# Patient Record
Sex: Female | Born: 1980 | Hispanic: Refuse to answer | Marital: Single | Smoking: Never smoker
Health system: Southern US, Community
[De-identification: ages and names within clinical notes are randomized; demographics above are authoritative.]

## PROBLEM LIST (undated history)

## (undated) DIAGNOSIS — O24419 Gestational diabetes mellitus in pregnancy, unspecified control: Secondary | ICD-10-CM

## (undated) DIAGNOSIS — N979 Female infertility, unspecified: Secondary | ICD-10-CM

## (undated) HISTORY — PX: NO PAST SURGERIES: SHX2092

## (undated) HISTORY — DX: Gestational diabetes mellitus in pregnancy, unspecified control: O24.419

## (undated) HISTORY — DX: Female infertility, unspecified: N97.9

---

## 2019-03-24 LAB — OB RESULTS CONSOLE GC/CHLAMYDIA
Chlamydia: NEGATIVE
Gonorrhea: NEGATIVE

## 2019-03-24 LAB — OB RESULTS CONSOLE PLATELET COUNT: Platelets: 363

## 2019-03-24 LAB — OB RESULTS CONSOLE HIV ANTIBODY (ROUTINE TESTING): HIV: NONREACTIVE

## 2019-03-24 LAB — OB RESULTS CONSOLE ANTIBODY SCREEN: Antibody Screen: NEGATIVE

## 2019-03-24 LAB — OB RESULTS CONSOLE HEPATITIS B SURFACE ANTIGEN: Hepatitis B Surface Ag: NEGATIVE

## 2019-03-24 LAB — OB RESULTS CONSOLE HGB/HCT, BLOOD
HCT: 33 (ref 29–41)
Hemoglobin: 10.8

## 2019-03-24 LAB — GLUCOSE, 1 HOUR
Drug Screen, Urine: NEGATIVE
Glucose 1 Hour: 157
Pap: NEGATIVE
Urine Culture, OB: NEGATIVE

## 2019-03-24 LAB — OB RESULTS CONSOLE RPR: RPR: NONREACTIVE

## 2019-03-24 LAB — OB RESULTS CONSOLE VARICELLA ZOSTER ANTIBODY, IGG: Varicella: IMMUNE

## 2019-03-24 LAB — OB RESULTS CONSOLE ABO/RH: RH Type: POSITIVE

## 2019-03-24 LAB — OB RESULTS CONSOLE RUBELLA ANTIBODY, IGM: Rubella: IMMUNE

## 2019-03-31 LAB — GLUCOSE, 3 HOUR GESTATIONAL
Glucose 1 Hour: 217
Glucose 2 Hour: 152
Glucose 3 Hour: 137
Glucose Tolerance, Fasting: 99 (ref 70–99)

## 2019-04-11 ENCOUNTER — Encounter: Payer: Self-pay | Admitting: *Deleted

## 2019-04-11 DIAGNOSIS — O0990 Supervision of high risk pregnancy, unspecified, unspecified trimester: Secondary | ICD-10-CM

## 2019-04-11 DIAGNOSIS — O24419 Gestational diabetes mellitus in pregnancy, unspecified control: Secondary | ICD-10-CM

## 2019-04-19 ENCOUNTER — Ambulatory Visit (INDEPENDENT_AMBULATORY_CARE_PROVIDER_SITE_OTHER): Payer: Medicaid Other | Admitting: Family Medicine

## 2019-04-19 ENCOUNTER — Ambulatory Visit: Payer: Medicaid Other | Admitting: *Deleted

## 2019-04-19 ENCOUNTER — Encounter: Payer: Self-pay | Admitting: Family Medicine

## 2019-04-19 ENCOUNTER — Encounter: Payer: Medicaid Other | Attending: Obstetrics & Gynecology | Admitting: *Deleted

## 2019-04-19 ENCOUNTER — Other Ambulatory Visit: Payer: Self-pay

## 2019-04-19 ENCOUNTER — Encounter: Payer: Self-pay | Admitting: General Practice

## 2019-04-19 VITALS — BP 118/78 | HR 86 | Wt 150.0 lb

## 2019-04-19 DIAGNOSIS — O26842 Uterine size-date discrepancy, second trimester: Secondary | ICD-10-CM

## 2019-04-19 DIAGNOSIS — Z713 Dietary counseling and surveillance: Secondary | ICD-10-CM | POA: Diagnosis present

## 2019-04-19 DIAGNOSIS — O24419 Gestational diabetes mellitus in pregnancy, unspecified control: Secondary | ICD-10-CM

## 2019-04-19 DIAGNOSIS — O0992 Supervision of high risk pregnancy, unspecified, second trimester: Secondary | ICD-10-CM

## 2019-04-19 DIAGNOSIS — O09522 Supervision of elderly multigravida, second trimester: Secondary | ICD-10-CM

## 2019-04-19 DIAGNOSIS — Z3A24 24 weeks gestation of pregnancy: Secondary | ICD-10-CM

## 2019-04-19 DIAGNOSIS — O0990 Supervision of high risk pregnancy, unspecified, unspecified trimester: Secondary | ICD-10-CM

## 2019-04-19 MED ORDER — ACCU-CHEK FASTCLIX LANCETS MISC: 1.0000 | Freq: Four times a day (QID) | 12 refills | Status: DC

## 2019-04-19 MED ORDER — BLOOD PRESSURE KIT DEVI: 1.0000 | Freq: Once | 0 refills | Status: AC

## 2019-04-19 MED ORDER — ACCU-CHEK SOFTCLIX LANCETS MISC: 12 refills | Status: DC

## 2019-04-19 MED ORDER — GLUCOSE BLOOD VI STRP: ORAL_STRIP | 12 refills | Status: DC

## 2019-04-19 MED ORDER — ACCU-CHEK NANO SMARTVIEW W/DEVICE KIT: 1.0000 | PACK | 0 refills | Status: DC

## 2019-04-19 NOTE — Progress Notes (Signed)
  Subjective:  Melissa Solomon is a G1P0 3w5dbeing seen today for her first obstetrical visit. [facility].  Her obstetrical history is significant for first pregnancy and advanced maternal age. Patient does intend to breast feed. Pregnancy history fully reviewed.  Patient reports no complaints.  BP 118/78   Pulse 86   Wt 150 lb (68 kg)   LMP 10/28/2018 (Exact Date)   BMI 31.35 kg/m   HISTORY: OB History  Gravida Para Term Preterm AB Living  1            SAB TAB Ectopic Multiple Live Births               # Outcome Date GA Lbr Len/2nd Weight Sex Delivery Anes PTL Lv  1 Current             Past Medical History:  Diagnosis Date  . 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 No pertinent surgical history.  Family History  Problem Relation Age of Onset  . Hypertension Mother      Exam  BP 118/78   Pulse 86   Wt 150 lb (68 kg)   LMP 10/28/2018 (Exact Date)   BMI 31.35 kg/m   CONSTITUTIONAL: Well-developed, well-nourished female in no acute distress.  HENT:  Normocephalic, atraumatic, External right and left ear normal. Oropharynx is clear and moist EYES: Conjunctivae and EOM are normal. Pupils are equal, round, and reactive to light. No scleral icterus.  NECK: Normal range of motion, supple, no masses.  Normal thyroid.  CARDIOVASCULAR: Normal heart rate noted, regular rhythm RESPIRATORY: Clear to auscultation bilaterally. Effort and breath sounds normal, no problems with respiration noted. ABDOMEN: Soft, normal bowel sounds, no distention noted.  No tenderness, rebound or guarding.  MUSCULOSKELETAL: Normal range of motion. No tenderness.  No cyanosis, clubbing, or edema.  2+ distal pulses. SKIN: Skin is warm and dry. No rash noted. Not diaphoretic. No erythema. No pallor. NEUROLOGIC: Alert and oriented to person, place, and time. Normal reflexes, muscle tone  coordination. No cranial nerve deficit noted. PSYCHIATRIC: Normal mood and affect. Normal behavior. Normal judgment and thought content.    Assessment:    Pregnancy: G1P0 Patient Active Problem List   Diagnosis Date Noted  . Gestational diabetes mellitus (GDM) 04/11/2019  . Supervision of high risk pregnancy, antepartum 04/11/2019      Plan:   1. Supervision of high risk pregnancy, antepartum FHT normal. Size/date discrepancy - Blood Pressure Monitoring (BLOOD PRESSURE KIT) DEVI; 1 Device by Does not apply route once for 1 dose. High risk pregnancy o09.90; speaks spanish  Dispense: 1 each; Refill: 0  2. Gestational diabetes mellitus (GDM), antepartum, gestational diabetes method of control unspecified Discussed importnce of glycemic control.  Diabetes education today Supplies sent to pharmacy Delivery between 39-40weeks - [place] OB DET[place] +14 WK; Future  3. Advanced maternal age in multigravida, second trimester     Problem list reviewed and updated. 75% of 30 min visit spent on counseling and coordination of care.     JTruett Mainland12/29/2020

## 2019-04-19 NOTE — Progress Notes (Signed)
Patient was seen on 04/19/2019 for Gestational Diabetes self-management. EDD 08/04/2019. Patient states no history of GDM. Diet history obtained. Patient has limited starch intake, eats a good variety of all protein and non-starchy vegetables.  Beverages include water, and very little juice.  The following learning objectives were met by the patient :   States why dietary management is important in controlling blood glucose  Describes the effects of carbohydrates on blood glucose levels  Demonstrates ability to create a balanced meal plan  Demonstrates carbohydrate counting   States when to check blood glucose levels  Demonstrates proper blood glucose monitoring techniques  States the effect of stress and exercise on blood glucose levels  Plan:  Aim for 3 Carb Choices per meal (45 grams) +/- 1 either way  Aim for 1-2 Carbs per snack Begin reading food labels for Total Carbohydrate of foods If OK with your MD, consider  increasing your activity level by walking, Arm Chair Exercises or other activity daily as tolerated Begin checking BG before breakfast and 2 hours after first bite of breakfast, lunch and dinner as directed by MD  Bring Log Book/Sheet and meter to every medical appointment  Patient not appropriate for Baby Scripts due to language barrier and does feel comfortable using smart phone for data entry Take medication if directed by MD  Blood glucose monitor given: none - Demonstrated Accu-chek Guide me  Blood glucose monitor Rx called into pharmacy: Accu Check Guide with [employer]   Patient instructed to test pre breakfast and 2 hours each meal as directed by MD  Patient instructed to monitor glucose levels: FBS: 60 - 95 mg/dl 2 hour: <120 mg/dl  Patient received the following handouts:  Nutrition Diabetes and Pregnancy  Carbohydrate Counting List  BG Log Sheet  Patient will be seen for follow-up as needed.  

## 2019-04-21 ENCOUNTER — Other Ambulatory Visit: Payer: Self-pay

## 2019-04-21 ENCOUNTER — Other Ambulatory Visit: Payer: Self-pay | Admitting: *Deleted

## 2019-04-21 DIAGNOSIS — O24419 Gestational diabetes mellitus in pregnancy, unspecified control: Secondary | ICD-10-CM

## 2019-05-05 ENCOUNTER — Encounter: Payer: Self-pay | Admitting: Obstetrics and Gynecology

## 2019-05-05 ENCOUNTER — Other Ambulatory Visit: Payer: Self-pay

## 2019-05-05 ENCOUNTER — Ambulatory Visit (INDEPENDENT_AMBULATORY_CARE_PROVIDER_SITE_OTHER): Payer: Medicaid Other | Admitting: Obstetrics and Gynecology

## 2019-05-05 VITALS — BP 109/71 | HR 76 | Wt 148.0 lb

## 2019-05-05 DIAGNOSIS — Z3A27 27 weeks gestation of pregnancy: Secondary | ICD-10-CM

## 2019-05-05 DIAGNOSIS — O09522 Supervision of elderly multigravida, second trimester: Secondary | ICD-10-CM

## 2019-05-05 DIAGNOSIS — O09529 Supervision of elderly multigravida, unspecified trimester: Secondary | ICD-10-CM | POA: Insufficient documentation

## 2019-05-05 DIAGNOSIS — O09523 Supervision of elderly multigravida, third trimester: Secondary | ICD-10-CM

## 2019-05-05 DIAGNOSIS — Z23 Encounter for immunization: Secondary | ICD-10-CM

## 2019-05-05 DIAGNOSIS — O24410 Gestational diabetes mellitus in pregnancy, diet controlled: Secondary | ICD-10-CM

## 2019-05-05 DIAGNOSIS — O0990 Supervision of high risk pregnancy, unspecified, unspecified trimester: Secondary | ICD-10-CM

## 2019-05-05 DIAGNOSIS — O0992 Supervision of high risk pregnancy, unspecified, second trimester: Secondary | ICD-10-CM

## 2019-05-05 MED ORDER — METFORMIN HCL 500 MG PO TABS: 1000.0000 mg | ORAL_TABLET | Freq: Every day | ORAL | 3 refills | Status: DC

## 2019-05-05 MED ORDER — VITAFOL ULTRA 29-0.6-0.4-200 MG PO CAPS: 1.0000 | ORAL_CAPSULE | Freq: Every day | ORAL | 12 refills | Status: DC

## 2019-05-05 NOTE — Patient Instructions (Signed)
https://www.cdc.gov/vaccines/hcp/vis/vis-statements/tdap.pdf">  °Vacuna Tdap (tétanos, difteria y tos ferina): lo que debe saber °Tdap (Tetanus, Diphtheria, Pertussis) Vaccine: What You Need to Know °1. ¿Por qué vacunarse? °La vacuna Tdap puede prevenir el tétanos, la difteria y la tos ferina. °La difteria y la tos ferina se contagian de persona a persona. El tétanos ingresa al organismo a través de cortes o heridas. °· El TÉTANOS (T) provoca rigidez dolorosa en los músculos. El tétanos puede causar graves problemas de salud, como no poder abrir la boca, tener dificultad para tragar y respirar, o la muerte. °· La DIFTERIA (D) puede causar dificultad para respirar, insuficiencia cardíaca, parálisis o muerte. °· La TOS FERINA (aP) también conocida como “tos convulsa” puede causar tos violenta e incontrolable lo que hace difícil respirar, comer o beber. La tos ferina puede ser muy grave en los bebés y en los niños pequeños, y causar neumonía, convulsiones, daño cerebral o la muerte. En adolescentes y adultos, puede causar pérdida de peso, pérdida del control de la vejiga, desmayos y fracturas de costillas al toser de manera intensa. °2. Vacuna Tdap °La vacuna Tdap es solo para niños de 7 años en adelante, adolescentes y adultos.  °Los adolescentes debe recibir una dosis única de la vacuna Tdap, preferentemente a los 11 o 12 años. °Las mujeres embarazadas deben recibir una dosis de la vacuna Tdap en cada embarazo para proteger al recién nacido de la tos ferina. Los bebés tienen mayor riesgo de sufrir complicaciones graves y potencialmente mortales debido a la tos ferina. °Los adultos que nunca recibieron la vacuna Tdap deben recibir una dosis. °Además, los adultos deben recibir una dosis de refuerzo cada 10 años, o antes si la persona sufre una quemadura o una herida grave y sucia. Las dosis de refuerzo pueden ser de la vacuna Tdap o la Td (una vacuna diferente que protege contra el tétanos y la difteria, pero no contra  la tos ferina). °La vacuna Tdap puede ser administrada al mismo tiempo que otras vacunas. °3. Hable con el médico °Comuníquese con la persona que le coloca las vacunas si la persona que la recibe: °· Ha tenido una reacción alérgica después de una dosis anterior de cualquier vacuna contra el tétanos, la difteria o la tos ferina, o cualquier alergia grave, potencialmente mortal. °· Ha tenido un coma, disminución del nivel de la conciencia o convulsiones prolongadas dentro de los 7 días posteriores a una dosis anterior de cualquier vacuna contra la tos ferina (DTP, DTaP o Tdap). °· Tiene convulsiones u otro problema del sistema nervioso. °· Alguna vez tuvo síndrome de Guillain-Barré (también llamado SGB). °· Ha tenido dolor intenso o hinchazón después de una dosis anterior de cualquier vacuna contra el tétanos o la difteria. °En algunos casos, es posible que el médico decida posponer la aplicación de la vacuna Tdap para una visita en el futuro.  °Las personas que sufren trastornos menores, como un resfrío, pueden vacunarse. Las personas que tienen enfermedades moderadas o graves generalmente deben esperar hasta recuperarse para poder recibir la vacuna Tdap.  °Su médico puede darle más información. °4. Riesgos de una reacción a la vacuna °· Después de recibir la vacuna Tdap a veces se puede tener dolor, enrojecimiento o hinchazón en el lugar donde se aplicó la inyección, fiebre leve, dolor de cabeza, sensación de cansancio y náuseas, vómitos, diarrea o dolor de estómago. °Las personas a veces se desmayan después de procedimientos médicos, incluida la vacunación. Informe al médico si se siente mareado, tiene cambios en la visión o zumbidos   en los oídos.  °Al igual que con cualquier medicamento, existe una probabilidad muy remota de que una vacuna cause una reacción alérgica grave, otra lesión grave o la muerte. °5. ¿Qué pasa si se presenta un problema grave? °Podría producirse una reacción alérgica después de que la  persona vacunada abandone la clínica. Si observa signos de una reacción alérgica grave (ronchas, hinchazón de la cara y la garganta, dificultad para respirar, latidos cardíacos acelerados, mareos o debilidad), llame al 9-1-1 y lleve a la persona al hospital más cercano. °Si se presentan otros signos que le preocupan, comuníquese con su médico.  °Las reacciones adversas deben informarse al Sistema de Informe de Eventos Adversos de Vacunas (Vaccine Adverse Event Reporting System, VAERS). Por lo general, el médico presenta este informe o puede hacerlo usted mismo. Visite el sitio web del VAERS en www.vaers.hhs.gov o llame al 1-800-822-7967. El VAERS es solo para informar reacciones; su personal no proporciona asesoramiento médico. °6. Programa Nacional de Compensación de Daños por Vacunas °El Programa Nacional de Compensación de Daños por Vacunas (National Vaccine Injury Compensation Program, VICP) es un programa federal que fue creado para compensar a las personas que puedan haber sufrido daños al recibir ciertas vacunas. Visite el sitio web del VICP en www.hrsa.gov/vaccinecompensation o llame al 1-800-338-2382 para obtener más información acerca del programa y de cómo presentar un reclamo. Hay un límite de tiempo para presentar un reclamo de compensación. °7. ¿Cómo puedo obtener más información? °· Pregúntele a su médico. °· Comuníquese con el servicio de salud de su localidad o su estado. °· Comuníquese con los Centers for Disease Control and Prevention, CDC (Centros para el Control y la Prevención de Enfermedades): °? Llame al 1-800-232-4636 (1-800-CDC-INFO) o °? Visite el sitio web de los CDC en www.cdc.gov/vaccines °Declaración de información de la vacuna Tdap (tétanos, difteria y tos ferina) (04/24/2018) °Esta información no tiene como fin reemplazar el consejo del médico. Asegúrese de hacerle al médico cualquier pregunta que tenga. °Document Revised: 08/17/2018 Document Reviewed: 08/17/2018 °Elsevier Patient  Education © 2020 Elsevier Inc. ° ° °

## 2019-05-05 NOTE — Progress Notes (Signed)
   PRENATAL VISIT NOTE  Subjective:  Melissa Solomon is a 39 y.o. G1P0 at [redacted]w[redacted]d being seen today for ongoing prenatal care.  She is currently monitored for the following issues for this high-risk pregnancy and has Gestational diabetes mellitus (GDM); Supervision of high risk pregnancy, antepartum; and AMA (advanced maternal age) multigravida 35+ on their problem list.  Patient reports no complaints.  Contractions: Not present. Vag. Bleeding: None.  Movement: Present. Denies leaking of fluid.   The following portions of the patient's history were reviewed and updated as appropriate: allergies, current medications, past family history, past medical history, past social history, past surgical history and problem list.   Objective:   Vitals:   05/05/19 1045  BP: 109/71  Pulse: 76  Weight: 148 lb (67.1 kg)    Fetal Status: Fetal Heart Rate (bpm): 140 Fundal Height: 27 cm Movement: Present     General:  Alert, oriented and cooperative. Patient is in no acute distress.  Skin: Skin is warm and dry. No rash noted.   Cardiovascular: Normal heart rate noted  Respiratory: Normal respiratory effort, no problems with respiration noted  Abdomen: Soft, gravid, appropriate for gestational age.  Pain/Pressure: Absent     Pelvic: Cervical exam deferred        Extremities: Normal range of motion.  Edema: None  Mental Status: Normal mood and affect. Normal behavior. Normal judgment and thought content.   Assessment and Plan:  Pregnancy: G1P0 at [redacted]w[redacted]d 1. Supervision of high risk pregnancy, antepartum Patient is doing well without complaints Third trimester labs today - Tdap vaccine greater than or equal to 7yo IM  2. Diet controlled gestational diabetes mellitus (GDM) in third trimester CBGs reviewed and all fasting elevated. Will start metformin 1000 mg in evening RTC in 2 weeks to review CBGs  3. Multigravida of advanced maternal age in third trimester   Preterm labor symptoms and  general obstetric precautions including but not limited to vaginal bleeding, contractions, leaking of fluid and fetal movement were reviewed in detail with the patient. Please refer to After Visit Summary for other counseling recommendations.   No follow-ups on file.  No future appointments.  Catalina Antigua, MD

## 2019-05-06 LAB — CBC
Hematocrit: 32.5 % — ABNORMAL LOW (ref 34.0–46.6)
Hemoglobin: 11.1 g/dL (ref 11.1–15.9)
MCH: 30.6 pg (ref 26.6–33.0)
MCHC: 34.2 g/dL (ref 31.5–35.7)
MCV: 90 fL (ref 79–97)
Platelets: 327 10*3/uL (ref 150–450)
RBC: 3.63 x10E6/uL — ABNORMAL LOW (ref 3.77–5.28)
RDW: 12.4 % (ref 11.7–15.4)
WBC: 11.3 10*3/uL — ABNORMAL HIGH (ref 3.4–10.8)

## 2019-05-06 LAB — RPR: RPR Ser Ql: NONREACTIVE

## 2019-05-06 LAB — HIV ANTIBODY (ROUTINE TESTING W REFLEX): HIV Screen 4th Generation wRfx: NONREACTIVE

## 2019-05-19 ENCOUNTER — Encounter: Payer: Self-pay | Admitting: Obstetrics and Gynecology

## 2019-05-19 ENCOUNTER — Other Ambulatory Visit: Payer: Self-pay

## 2019-05-19 ENCOUNTER — Ambulatory Visit (INDEPENDENT_AMBULATORY_CARE_PROVIDER_SITE_OTHER): Payer: Medicaid Other | Admitting: Obstetrics and Gynecology

## 2019-05-19 VITALS — BP 104/68 | HR 81 | Wt 152.8 lb

## 2019-05-19 DIAGNOSIS — O0993 Supervision of high risk pregnancy, unspecified, third trimester: Secondary | ICD-10-CM

## 2019-05-19 DIAGNOSIS — Z3A29 29 weeks gestation of pregnancy: Secondary | ICD-10-CM

## 2019-05-19 DIAGNOSIS — O09523 Supervision of elderly multigravida, third trimester: Secondary | ICD-10-CM

## 2019-05-19 DIAGNOSIS — O24410 Gestational diabetes mellitus in pregnancy, diet controlled: Secondary | ICD-10-CM

## 2019-05-19 DIAGNOSIS — O0990 Supervision of high risk pregnancy, unspecified, unspecified trimester: Secondary | ICD-10-CM

## 2019-05-19 MED ORDER — INSULIN NPH (HUMAN) (ISOPHANE) 100 UNIT/ML ~~LOC~~ SUSP: 12.0000 [IU] | Freq: Every day | SUBCUTANEOUS | 3 refills | Status: DC

## 2019-05-19 NOTE — Progress Notes (Signed)
   PRENATAL VISIT NOTE  Subjective:  Melissa Solomon is a 39 y.o. G1P0 at [redacted]w[redacted]d being seen today for ongoing prenatal care.  She is currently monitored for the following issues for this high-risk pregnancy and has Gestational diabetes mellitus (GDM); Supervision of high risk pregnancy, antepartum; and AMA (advanced maternal age) multigravida 35+ on their problem list.  Patient reports no complaints.  Contractions: Irritability. Vag. Bleeding: None.  Movement: Present. Denies leaking of fluid.   The following portions of the patient's history were reviewed and updated as appropriate: allergies, current medications, past family history, past medical history, past social history, past surgical history and problem list.   Objective:   Vitals:   05/19/19 1435  BP: 104/68  Pulse: 81  Weight: 152 lb 12.8 oz (69.3 kg)    Fetal Status:     Movement: Present     General:  Alert, oriented and cooperative. Patient is in no acute distress.  Skin: Skin is warm and dry. No rash noted.   Cardiovascular: Normal heart rate noted  Respiratory: Normal respiratory effort, no problems with respiration noted  Abdomen: Soft, gravid, appropriate for gestational age.  Pain/Pressure: Absent     Pelvic: Cervical exam deferred        Extremities: Normal range of motion.  Edema: Trace  Mental Status: Normal mood and affect. Normal behavior. Normal judgment and thought content.   Assessment and Plan:  Pregnancy: G1P0 at [redacted]w[redacted]d 1. Supervision of high risk pregnancy, antepartum Patient is doing well without complaints  2. Gestational diabetes mellitus (GDM) in third trimester Patient started on metformin last visit CBGs reviewed and fasting persistently elevated as high as 115. Majority of pp within range Will start NPH 12 units at bedtime  Growth ultrasound ordered  3. Multigravida of advanced maternal age in third trimester   Preterm labor symptoms and general obstetric precautions including but  not limited to vaginal bleeding, contractions, leaking of fluid and fetal movement were reviewed in detail with the patient. Please refer to After Visit Summary for other counseling recommendations.   Return in about 2 weeks (around 06/02/2019) for Virtual, ROB, High risk.  No future appointments.  Catalina Antigua, MD

## 2019-05-27 ENCOUNTER — Ambulatory Visit (HOSPITAL_COMMUNITY): Payer: Medicaid Other

## 2019-06-02 ENCOUNTER — Encounter (HOSPITAL_COMMUNITY): Payer: Self-pay

## 2019-06-02 ENCOUNTER — Ambulatory Visit (HOSPITAL_COMMUNITY): Payer: Medicaid Other | Admitting: *Deleted

## 2019-06-02 ENCOUNTER — Ambulatory Visit (HOSPITAL_COMMUNITY)
Admission: RE | Admit: 2019-06-02 | Discharge: 2019-06-02 | Disposition: A | Discharge status: Home / Self Care | Payer: Medicaid Other | Source: Ambulatory Visit | Attending: Obstetrics and Gynecology | Admitting: Obstetrics and Gynecology

## 2019-06-02 ENCOUNTER — Encounter: Payer: Medicaid Other | Admitting: Obstetrics & Gynecology

## 2019-06-02 ENCOUNTER — Other Ambulatory Visit: Payer: Self-pay

## 2019-06-02 ENCOUNTER — Other Ambulatory Visit (HOSPITAL_COMMUNITY): Payer: Self-pay | Admitting: *Deleted

## 2019-06-02 DIAGNOSIS — O24414 Gestational diabetes mellitus in pregnancy, insulin controlled: Secondary | ICD-10-CM

## 2019-06-02 DIAGNOSIS — O0990 Supervision of high risk pregnancy, unspecified, unspecified trimester: Secondary | ICD-10-CM | POA: Diagnosis present

## 2019-06-02 DIAGNOSIS — Z3A31 31 weeks gestation of pregnancy: Secondary | ICD-10-CM

## 2019-06-02 DIAGNOSIS — O24419 Gestational diabetes mellitus in pregnancy, unspecified control: Secondary | ICD-10-CM | POA: Diagnosis present

## 2019-06-02 DIAGNOSIS — O09513 Supervision of elderly primigravida, third trimester: Secondary | ICD-10-CM

## 2019-06-02 NOTE — Progress Notes (Deleted)
   Patient did not show up today for her scheduled appointment.   Brannen Koppen, MD, FACOG Obstetrician & Gynecologist, [facility], [facility]  

## 2019-06-09 ENCOUNTER — Encounter: Payer: Medicaid Other | Admitting: Obstetrics & Gynecology

## 2019-06-09 ENCOUNTER — Ambulatory Visit (HOSPITAL_COMMUNITY): Admission: RE | Admit: 2019-06-09 | Payer: Medicaid Other | Source: Ambulatory Visit

## 2019-06-09 ENCOUNTER — Ambulatory Visit (HOSPITAL_COMMUNITY): Payer: Medicaid Other

## 2019-06-10 ENCOUNTER — Ambulatory Visit (HOSPITAL_COMMUNITY): Payer: Medicaid Other | Attending: Obstetrics and Gynecology

## 2019-06-10 ENCOUNTER — Encounter (HOSPITAL_COMMUNITY): Payer: Self-pay

## 2019-06-10 ENCOUNTER — Inpatient Hospital Stay (HOSPITAL_COMMUNITY): Admission: RE | Admit: 2019-06-10 | Payer: Medicaid Other | Source: Ambulatory Visit

## 2019-06-13 ENCOUNTER — Ambulatory Visit (INDEPENDENT_AMBULATORY_CARE_PROVIDER_SITE_OTHER): Payer: Medicaid Other | Admitting: Family Medicine

## 2019-06-13 ENCOUNTER — Other Ambulatory Visit: Payer: Self-pay

## 2019-06-13 ENCOUNTER — Encounter: Payer: Self-pay | Admitting: Family Medicine

## 2019-06-13 VITALS — BP 108/64 | HR 76 | Wt 151.0 lb

## 2019-06-13 DIAGNOSIS — Z3A32 32 weeks gestation of pregnancy: Secondary | ICD-10-CM

## 2019-06-13 DIAGNOSIS — O09523 Supervision of elderly multigravida, third trimester: Secondary | ICD-10-CM

## 2019-06-13 DIAGNOSIS — O0990 Supervision of high risk pregnancy, unspecified, unspecified trimester: Secondary | ICD-10-CM

## 2019-06-13 DIAGNOSIS — O24414 Gestational diabetes mellitus in pregnancy, insulin controlled: Secondary | ICD-10-CM

## 2019-06-13 MED ORDER — VITAFOL ULTRA 29-0.6-0.4-200 MG PO CAPS: 1.0000 | ORAL_CAPSULE | Freq: Every day | ORAL | 12 refills | Status: AC

## 2019-06-13 MED ORDER — INSULIN NPH (HUMAN) (ISOPHANE) 100 UNIT/ML ~~LOC~~ SUSP: 12.0000 [IU] | Freq: Every day | SUBCUTANEOUS | 3 refills | Status: DC

## 2019-06-13 MED ORDER — GLUCOSE BLOOD VI STRP: ORAL_STRIP | 12 refills | Status: DC

## 2019-06-13 NOTE — Progress Notes (Signed)
Spanish Interpreter Raquel M.  

## 2019-06-13 NOTE — Progress Notes (Signed)
Subjective:  Melissa Solomon is a 39 y.o. G1P0 at [redacted]w[redacted]d being seen today for ongoing prenatal care.  She is currently monitored for the following issues for this high-risk pregnancy and has Gestational diabetes mellitus (GDM); Supervision of high risk pregnancy, antepartum; and AMA (advanced maternal age) multigravida 35+ on their problem list.  GDM: Patient taking insulin NPH 12 units at night.  Reports a couple hypoglycemic episodes.  Tolerating medication well Fasting: controlled - 94-95 2hr PP: controlled  Patient reports no complaints.  Contractions: Irritability. Vag. Bleeding: None.  Movement: Present. Denies leaking of fluid.   The following portions of the patient's history were reviewed and updated as appropriate: allergies, current medications, past family history, past medical history, past social history, past surgical history and problem list. Problem list updated.  Objective:   Vitals:   06/13/19 0841  BP: 108/64  Pulse: 76  Weight: 151 lb (68.5 kg)    Fetal Status: Fetal Heart Rate (bpm): 138   Movement: Present     General:  Alert, oriented and cooperative. Patient is in no acute distress.  Skin: Skin is warm and dry. No rash noted.   Cardiovascular: Normal heart rate noted  Respiratory: Normal respiratory effort, no problems with respiration noted  Abdomen: Soft, gravid, appropriate for gestational age. Pain/Pressure: Absent     Pelvic: Vag. Bleeding: None     Cervical exam deferred        Extremities: Normal range of motion.  Edema: Trace  Mental Status: Normal mood and affect. Normal behavior. Normal judgment and thought content.   Urinalysis:      Assessment and Plan:  Pregnancy: G1P0 at [redacted]w[redacted]d  1. Supervision of high risk pregnancy, antepartum FHT and FH normal  2. Multigravida of advanced maternal age in third trimester  3. Insulin controlled gestational diabetes mellitus (GDM) in third trimester Continue NPH at night Continue to watch CBGs  closely BPP on Thursday Induction at 39 weeks  Preterm labor symptoms and general obstetric precautions including but not limited to vaginal bleeding, contractions, leaking of fluid and fetal movement were reviewed in detail with the patient. Please refer to After Visit Summary for other counseling recommendations.  Return in about 2 weeks (around 06/27/2019).   Levie Heritage, DO

## 2019-06-16 ENCOUNTER — Encounter (HOSPITAL_COMMUNITY): Payer: Self-pay

## 2019-06-16 ENCOUNTER — Ambulatory Visit (HOSPITAL_COMMUNITY): Payer: Medicaid Other | Admitting: *Deleted

## 2019-06-16 ENCOUNTER — Other Ambulatory Visit: Payer: Self-pay

## 2019-06-16 ENCOUNTER — Ambulatory Visit (HOSPITAL_COMMUNITY)
Admission: RE | Admit: 2019-06-16 | Discharge: 2019-06-16 | Disposition: A | Discharge status: Home / Self Care | Payer: Medicaid Other | Source: Ambulatory Visit | Attending: Obstetrics and Gynecology | Admitting: Obstetrics and Gynecology

## 2019-06-16 DIAGNOSIS — O09523 Supervision of elderly multigravida, third trimester: Secondary | ICD-10-CM | POA: Insufficient documentation

## 2019-06-16 DIAGNOSIS — O24414 Gestational diabetes mellitus in pregnancy, insulin controlled: Secondary | ICD-10-CM

## 2019-06-16 DIAGNOSIS — Z3A33 33 weeks gestation of pregnancy: Secondary | ICD-10-CM

## 2019-06-16 DIAGNOSIS — O0990 Supervision of high risk pregnancy, unspecified, unspecified trimester: Secondary | ICD-10-CM | POA: Diagnosis present

## 2019-06-16 DIAGNOSIS — O09513 Supervision of elderly primigravida, third trimester: Secondary | ICD-10-CM | POA: Diagnosis not present

## 2019-06-23 ENCOUNTER — Ambulatory Visit (HOSPITAL_COMMUNITY)
Admission: RE | Admit: 2019-06-23 | Discharge: 2019-06-23 | Disposition: A | Discharge status: Home / Self Care | Payer: Medicaid Other | Source: Ambulatory Visit | Attending: Obstetrics and Gynecology | Admitting: Obstetrics and Gynecology

## 2019-06-23 ENCOUNTER — Encounter (HOSPITAL_COMMUNITY): Payer: Self-pay

## 2019-06-23 ENCOUNTER — Ambulatory Visit (HOSPITAL_COMMUNITY): Payer: Medicaid Other | Admitting: *Deleted

## 2019-06-23 ENCOUNTER — Other Ambulatory Visit: Payer: Self-pay

## 2019-06-23 DIAGNOSIS — O0990 Supervision of high risk pregnancy, unspecified, unspecified trimester: Secondary | ICD-10-CM | POA: Insufficient documentation

## 2019-06-23 DIAGNOSIS — O24414 Gestational diabetes mellitus in pregnancy, insulin controlled: Secondary | ICD-10-CM | POA: Insufficient documentation

## 2019-06-23 DIAGNOSIS — O09513 Supervision of elderly primigravida, third trimester: Secondary | ICD-10-CM | POA: Diagnosis not present

## 2019-06-23 DIAGNOSIS — Z3A34 34 weeks gestation of pregnancy: Secondary | ICD-10-CM

## 2019-06-23 DIAGNOSIS — O09523 Supervision of elderly multigravida, third trimester: Secondary | ICD-10-CM | POA: Insufficient documentation

## 2019-06-24 ENCOUNTER — Other Ambulatory Visit (HOSPITAL_COMMUNITY): Payer: Self-pay | Admitting: *Deleted

## 2019-06-24 DIAGNOSIS — O24414 Gestational diabetes mellitus in pregnancy, insulin controlled: Secondary | ICD-10-CM

## 2019-06-30 ENCOUNTER — Ambulatory Visit (HOSPITAL_COMMUNITY)
Admission: RE | Admit: 2019-06-30 | Discharge: 2019-06-30 | Disposition: A | Discharge status: Home / Self Care | Payer: Medicaid Other | Source: Ambulatory Visit | Attending: Obstetrics and Gynecology | Admitting: Obstetrics and Gynecology

## 2019-06-30 ENCOUNTER — Encounter (HOSPITAL_COMMUNITY): Payer: Self-pay

## 2019-06-30 ENCOUNTER — Other Ambulatory Visit: Payer: Self-pay

## 2019-06-30 ENCOUNTER — Ambulatory Visit (HOSPITAL_COMMUNITY): Payer: Medicaid Other | Admitting: *Deleted

## 2019-06-30 ENCOUNTER — Ambulatory Visit (INDEPENDENT_AMBULATORY_CARE_PROVIDER_SITE_OTHER): Payer: Medicaid Other | Admitting: Obstetrics & Gynecology

## 2019-06-30 VITALS — BP 122/68 | HR 79 | Wt 153.7 lb

## 2019-06-30 DIAGNOSIS — O09513 Supervision of elderly primigravida, third trimester: Secondary | ICD-10-CM

## 2019-06-30 DIAGNOSIS — O0990 Supervision of high risk pregnancy, unspecified, unspecified trimester: Secondary | ICD-10-CM

## 2019-06-30 DIAGNOSIS — Z3A35 35 weeks gestation of pregnancy: Secondary | ICD-10-CM

## 2019-06-30 DIAGNOSIS — O09523 Supervision of elderly multigravida, third trimester: Secondary | ICD-10-CM

## 2019-06-30 DIAGNOSIS — O24414 Gestational diabetes mellitus in pregnancy, insulin controlled: Secondary | ICD-10-CM

## 2019-06-30 DIAGNOSIS — Z362 Encounter for other antenatal screening follow-up: Secondary | ICD-10-CM | POA: Diagnosis not present

## 2019-06-30 DIAGNOSIS — O24410 Gestational diabetes mellitus in pregnancy, diet controlled: Secondary | ICD-10-CM | POA: Insufficient documentation

## 2019-06-30 NOTE — Progress Notes (Addendum)
   PRENATAL VISIT NOTE  Subjective:  Melissa Solomon is a 39 y.o. G1P0 at [redacted]w[redacted]d being seen today for ongoing prenatal care.  She is currently monitored for the following issues for this high-risk pregnancy and has Gestational diabetes mellitus (GDM); Supervision of high risk pregnancy, antepartum; and AMA (advanced maternal age) multigravida 35+ on their problem list.  Patient reports no complaints.  Contractions: Not present. Vag. Bleeding: None.  Movement: Present. Denies leaking of fluid.   The following portions of the patient's history were reviewed and updated as appropriate: allergies, current medications, past family history, past medical history, past social history, past surgical history and problem list.   Objective:   Vitals:   06/30/19 1614  BP: 122/68  Pulse: 79  Weight: 153 lb 11.2 oz (69.7 kg)    Fetal Status: Fetal Heart Rate (bpm): 128 Fundal Height: 35 cm Movement: Present     General:  Alert, oriented and cooperative. Patient is in no acute distress.  Skin: Skin is warm and dry. No rash noted.   Cardiovascular: Normal heart rate noted  Respiratory: Normal respiratory effort, no problems with respiration noted  Abdomen: Soft, gravid, appropriate for gestational age.  Pain/Pressure: Present     Pelvic: Cervical exam deferred        Extremities: Normal range of motion.  Edema: Trace  Mental Status: Normal mood and affect. Normal behavior. Normal judgment and thought content.   Assessment and Plan:  Pregnancy: G1P0 at [redacted]w[redacted]d 1. Insulin controlled gestational diabetes mellitus (GDM) in third trimester Control is good, Babyscripts data seen  2. Supervision of high risk pregnancy, antepartum Good fetal growth  3. Multigravida of advanced maternal age in third trimester   Preterm labor symptoms and general obstetric precautions including but not limited to vaginal bleeding, contractions, leaking of fluid and fetal movement were reviewed in detail with the  patient. Please refer to After Visit Summary for other counseling recommendations.   Return in about 1 week (around 07/07/2019) for GBS.  Future Appointments  Date Time Provider Department Center  07/07/2019  1:45 PM [facility] NURSE [facility]  07/07/2019  1:45 PM [facility] [place] 5 [facility]  07/14/2019  1:45 PM [facility] NURSE [facility]  07/14/2019  1:45 PM [facility] [place] [facility]    Scheryl Darter, MD

## 2019-06-30 NOTE — Patient Instructions (Addendum)

## 2019-06-30 NOTE — Progress Notes (Signed)
BRx Glucose readings:       

## 2019-07-06 ENCOUNTER — Encounter: Payer: Self-pay | Admitting: Obstetrics and Gynecology

## 2019-07-06 ENCOUNTER — Ambulatory Visit (INDEPENDENT_AMBULATORY_CARE_PROVIDER_SITE_OTHER): Payer: Medicaid Other | Admitting: Obstetrics and Gynecology

## 2019-07-06 ENCOUNTER — Other Ambulatory Visit: Payer: Self-pay

## 2019-07-06 ENCOUNTER — Other Ambulatory Visit (HOSPITAL_COMMUNITY)
Admission: RE | Admit: 2019-07-06 | Discharge: 2019-07-06 | Disposition: A | Discharge status: Home / Self Care | Payer: Medicaid Other | Source: Ambulatory Visit | Attending: Obstetrics and Gynecology | Admitting: Obstetrics and Gynecology

## 2019-07-06 VITALS — BP 117/73 | HR 82 | Wt 156.3 lb

## 2019-07-06 DIAGNOSIS — O0990 Supervision of high risk pregnancy, unspecified, unspecified trimester: Secondary | ICD-10-CM

## 2019-07-06 DIAGNOSIS — O09523 Supervision of elderly multigravida, third trimester: Secondary | ICD-10-CM

## 2019-07-06 DIAGNOSIS — O24414 Gestational diabetes mellitus in pregnancy, insulin controlled: Secondary | ICD-10-CM

## 2019-07-06 DIAGNOSIS — Z3A35 35 weeks gestation of pregnancy: Secondary | ICD-10-CM

## 2019-07-06 NOTE — Progress Notes (Signed)
Subjective:  Melissa Solomon is a 39 y.o. G1P0 at [redacted]w[redacted]d being seen today for ongoing prenatal care.  She is currently monitored for the following issues for this high-risk pregnancy and has Gestational diabetes mellitus (GDM); Supervision of high risk pregnancy, antepartum; and AMA (advanced maternal age) multigravida 35+ on their problem list.  Patient reports no complaints.  Contractions: Not present. Vag. Bleeding: None.  Movement: Present. Denies leaking of fluid.   The following portions of the patient's history were reviewed and updated as appropriate: allergies, current medications, past family history, past medical history, past social history, past surgical history and problem list. Problem list updated.  Objective:   Vitals:   07/06/19 1558  BP: 117/73  Pulse: 82  Weight: 156 lb 4.8 oz (70.9 kg)    Fetal Status: Fetal Heart Rate (bpm): 153   Movement: Present     General:  Alert, oriented and cooperative. Patient is in no acute distress.  Skin: Skin is warm and dry. No rash noted.   Cardiovascular: Normal heart rate noted  Respiratory: Normal respiratory effort, no problems with respiration noted  Abdomen: Soft, gravid, appropriate for gestational age. Pain/Pressure: Present     Pelvic:  Cervical exam performed        Extremities: Normal range of motion.  Edema: Trace  Mental Status: Normal mood and affect. Normal behavior. Normal judgment and thought content.   Urinalysis:      Assessment and Plan:  Pregnancy: G1P0 at [redacted]w[redacted]d  1. Supervision of high risk pregnancy, antepartum Stable GBS collected Labor precautions  2. Multigravida of advanced maternal age in third trimester Neg Quad  3. Insulin controlled gestational diabetes mellitus (GDM) in third trimester CBG's reviewed and in goal range BPP tomorrow Continue with current regiment  Term labor symptoms and general obstetric precautions including but not limited to vaginal bleeding, contractions, leaking  of fluid and fetal movement were reviewed in detail with the patient. Please refer to After Visit Summary for other counseling recommendations.  Return in about 2 weeks (around 07/20/2019) for OB visit, face to face, MD provider.   Hermina Staggers, MD

## 2019-07-06 NOTE — Progress Notes (Signed)
Glucos readings in BRx:

## 2019-07-06 NOTE — Patient Instructions (Signed)
Tercer trimestre de embarazo Third Trimester of Pregnancy  El tercer trimestre comprende desde la semana28 hasta la semana40 (desde el mes7 hasta el mes9). En este trimestre, el beb en gestacin (feto) crece muy rpidamente. Hacia el final del noveno mes, el beb en gestacin mide alrededor de 20pulgadas (45cm) de largo. Pesa entre 6y 10libras (2,70y 4,50kg). Siga estas indicaciones en su casa: Medicamentos  Tome los medicamentos de venta libre y los recetados solamente como se lo haya indicado el mdico. Algunos medicamentos son seguros para tomar durante el embarazo y otros no lo son.  Tome vitaminas prenatales que contengan por lo menos 600microgramos (?g) de cido flico.  Si tiene dificultad para mover el intestino (estreimiento), tome un medicamento para ablandar las heces (laxante) si su mdico se lo autoriza. Comida y bebida   Ingiera alimentos saludables de manera regular.  No coma carne cruda ni quesos sin cocinar.  Si obtiene poca cantidad de calcio de los alimentos que ingiere, consulte a su mdico sobre la posibilidad de tomar un suplemento diario de calcio.  La ingesta diaria de cuatro o cinco comidas pequeas en lugar de tres comidas abundantes.  Evite el consumo de alimentos ricos en grasas y azcares, como los alimentos fritos y los dulces.  Para evitar el estreimiento: ? Consuma alimentos ricos en fibra, como frutas y verduras frescas, cereales integrales y frijoles. ? Beba suficiente lquido para mantener el pis (orina) claro o de color amarillo plido. Actividad  Haga ejercicios solamente como se lo haya indicado el mdico. Interrumpa la actividad fsica si comienza a tener calambres.  No levante objetos pesados, use zapatos de tacones bajos y sintese derecha.  No haga ejercicio si hace demasiado calor, hay demasiada humedad o se encuentra en un lugar de mucha altura (altitud alta).  Puede continuar teniendo relaciones sexuales, a menos que el  mdico le indique lo contrario. Alivio del dolor y del malestar  Use un sostn que le brinde buen soporte si sus mamas estn sensibles.  Haga pausas frecuentes y descanse con las piernas levantadas si tiene calambres en las piernas o dolor en la zona lumbar.  Dese baos de asiento con agua tibia para aliviar el dolor o las molestias causadas por las hemorroides. Use una crema para las hemorroides si el mdico la autoriza.  Si desarrolla venas hinchadas y abultadas (vrices) en las piernas: ? Use medias de compresin o medias de descanso como se lo haya indicado el mdico. ? Levante (eleve) los pies durante 15minutos, 3 o 4veces por da. ? Limite el consumo de sal en sus alimentos. Seguridad  Colquese el cinturn de seguridad cuando conduzca.  Haga una lista de los nmeros de telfono de emergencia, que incluya los nmeros de telfono de familiares, amigos, el hospital, as como los departamentos de polica y bomberos. Preparacin para la llegada del beb Para prepararse para la llegada de su beb:  Tome clases prenatales.  Practique ir manejando al hospital.  Visite el hospital y recorra el rea de maternidad.  Hable en su trabajo acerca de tomar licencia cuando llegue el beb.  Prepare el bolso que llevar al hospital.  Prepare la habitacin del beb.  Concurra a los controles mdicos.  Compre un asiento de seguridad orientado hacia atrs para llevar al beb en el automvil. Aprenda cmo instalarlo en el auto. Instrucciones generales  No se d baos de inmersin en agua caliente, baos turcos ni saunas.  No consuma ningn producto que contenga nicotina o tabaco, como cigarrillos y cigarrillos   electrnicos. Si necesita ayuda para dejar de fumar, consulte al mdico.  No beba alcohol.  No se haga duchas vaginales ni use tampones o toallas higinicas perfumadas.  No mantenga las piernas cruzadas durante mucho tiempo.  No haga viajes de larga distancia, excepto si es  obligatorio. Hgalos solamente si su mdico la autoriza.  Visite a su dentista si no lo ha hecho durante el embarazo. Use un cepillo de cerdas suaves para cepillarse los dientes. Psese el hilo dental con suavidad.  Evite el contacto con las bandejas sanitarias de los gatos y la tierra que estos animales usan. Estos elementos contienen bacterias que pueden causar defectos congnitos al beb y la posible prdida del beb (aborto espontneo) o la muerte fetal.  Concurra a todas las visitas prenatales como se lo haya indicado el mdico. Esto es importante. Comunquese con un mdico si:  No est segura de si est en trabajo de parto o si ha roto la bolsa de las aguas.  Tiene mareos.  Tiene clicos leves o siente presin en la parte baja del vientre.  Sufre un dolor persistente en el abdomen.  Sigue teniendo malestar estomacal, vomita o tiene heces lquidas.  Advierte un lquido con olor ftido que proviene de la vagina.  Siente dolor al orinar. Solicite ayuda de inmediato si:  Tiene fiebre.  Tiene una prdida de lquido por la vagina.  Tiene sangrado o pequeas prdidas vaginales.  Siente dolor intenso o clicos en el abdomen.  Aumenta o baja de peso rpidamente.  Tiene dificultades para recuperar el aliento y siente dolor en el pecho.  Sbitamente se le hinchan mucho el rostro, las manos, los tobillos, los pies o las piernas.  No ha sentido los movimientos del beb durante una hora.  Siente un dolor de cabeza intenso que no se alivia con medicamentos.  Tiene dificultad para ver.  Tiene prdida de lquido o le sale un chorro de lquido de la vagina antes de estar en la semana 37.  Tiene espasmos abdominales (contracciones) regulares antes de estar en la semana 37. Resumen  El tercer trimestre comprende desde la semana28 hasta la semana40 (desde el mes7 hasta el mes9). Esta es la poca en que el beb en gestacin crece muy rpidamente.  Siga los consejos del mdico  con respecto a los medicamentos, la alimentacin y la actividad.  Preprese para la llegada del beb tomando las clases prenatales, preparando todo lo que necesitar el beb, arreglando la habitacin del beb y concurriendo a los controles mdicos.  Solicite ayuda de inmediato si tiene sangrado por la vagina, siente dolor en el pecho o tiene dificultad para respirar, o si no ha sentido que su beb se mueve en el transcurso de ms de una hora. Esta informacin no tiene como fin reemplazar el consejo del mdico. Asegrese de hacerle al mdico cualquier pregunta que tenga. Document Revised: 11/10/2016 Document Reviewed: 11/10/2016 Elsevier Patient Education  2020 Elsevier Inc.  

## 2019-07-07 ENCOUNTER — Other Ambulatory Visit (HOSPITAL_COMMUNITY): Payer: Self-pay | Admitting: *Deleted

## 2019-07-07 ENCOUNTER — Ambulatory Visit (HOSPITAL_COMMUNITY)
Admission: RE | Admit: 2019-07-07 | Discharge: 2019-07-07 | Disposition: A | Discharge status: Home / Self Care | Payer: Medicaid Other | Source: Ambulatory Visit | Attending: Obstetrics and Gynecology | Admitting: Obstetrics and Gynecology

## 2019-07-07 ENCOUNTER — Encounter (HOSPITAL_COMMUNITY): Payer: Self-pay | Admitting: *Deleted

## 2019-07-07 ENCOUNTER — Ambulatory Visit (HOSPITAL_COMMUNITY): Payer: Medicaid Other | Admitting: *Deleted

## 2019-07-07 DIAGNOSIS — Z362 Encounter for other antenatal screening follow-up: Secondary | ICD-10-CM | POA: Diagnosis not present

## 2019-07-07 DIAGNOSIS — Z3A36 36 weeks gestation of pregnancy: Secondary | ICD-10-CM | POA: Diagnosis not present

## 2019-07-07 DIAGNOSIS — O09523 Supervision of elderly multigravida, third trimester: Secondary | ICD-10-CM | POA: Diagnosis present

## 2019-07-07 DIAGNOSIS — O24414 Gestational diabetes mellitus in pregnancy, insulin controlled: Secondary | ICD-10-CM | POA: Diagnosis not present

## 2019-07-07 DIAGNOSIS — O0990 Supervision of high risk pregnancy, unspecified, unspecified trimester: Secondary | ICD-10-CM | POA: Diagnosis present

## 2019-07-07 DIAGNOSIS — O09513 Supervision of elderly primigravida, third trimester: Secondary | ICD-10-CM

## 2019-07-07 DIAGNOSIS — O09519 Supervision of elderly primigravida, unspecified trimester: Secondary | ICD-10-CM

## 2019-07-07 LAB — GC/CHLAMYDIA PROBE AMP (~~LOC~~) NOT AT ARMC
Chlamydia: NEGATIVE
Comment: NEGATIVE
Comment: NORMAL
Neisseria Gonorrhea: NEGATIVE

## 2019-07-09 LAB — CULTURE, BETA STREP (GROUP B ONLY): Strep Gp B Culture: NEGATIVE

## 2019-07-14 ENCOUNTER — Encounter: Payer: Self-pay | Admitting: Obstetrics and Gynecology

## 2019-07-14 ENCOUNTER — Ambulatory Visit (HOSPITAL_COMMUNITY): Admission: RE | Admit: 2019-07-14 | Payer: Medicaid Other | Source: Ambulatory Visit

## 2019-07-14 ENCOUNTER — Ambulatory Visit (INDEPENDENT_AMBULATORY_CARE_PROVIDER_SITE_OTHER): Payer: Medicaid Other | Admitting: Obstetrics and Gynecology

## 2019-07-14 ENCOUNTER — Ambulatory Visit (HOSPITAL_COMMUNITY): Payer: Medicaid Other

## 2019-07-14 ENCOUNTER — Other Ambulatory Visit: Payer: Self-pay

## 2019-07-14 DIAGNOSIS — O24414 Gestational diabetes mellitus in pregnancy, insulin controlled: Secondary | ICD-10-CM

## 2019-07-14 DIAGNOSIS — O09523 Supervision of elderly multigravida, third trimester: Secondary | ICD-10-CM

## 2019-07-14 DIAGNOSIS — Z3A37 37 weeks gestation of pregnancy: Secondary | ICD-10-CM

## 2019-07-14 DIAGNOSIS — O0990 Supervision of high risk pregnancy, unspecified, unspecified trimester: Secondary | ICD-10-CM

## 2019-07-14 NOTE — Progress Notes (Signed)
   TELEHEALTH VIRTUAL OBSTETRICS VISIT ENCOUNTER NOTE  I connected with Melissa Solomon on 07/14/19 at 10:35 AM EDT by telephone at home and verified that I am speaking with the correct person using two identifiers.   I discussed the limitations, risks, security and privacy concerns of performing an evaluation and management service by telephone and the availability of in person appointments. I also discussed with the patient that there may be a patient responsible charge related to this service. The patient expressed understanding and agreed to proceed.  Subjective:  Melissa Solomon is a 39 y.o. G1P0 at [redacted]w[redacted]d being followed for ongoing prenatal care.  She is currently monitored for the following issues for this high-risk pregnancy and has Gestational diabetes mellitus (GDM); Supervision of high risk pregnancy, antepartum; and AMA (advanced maternal age) multigravida 35+ on their problem list.  Patient reports diarrhea. Reports fetal movement. Denies any contractions, bleeding or leaking of fluid.   The following portions of the patient's history were reviewed and updated as appropriate: allergies, current medications, past family history, past medical history, past social history, past surgical history and problem list.   Objective:   General:  Alert, oriented and cooperative.   Mental Status: Normal mood and affect perceived. Normal judgment and thought content.  Rest of physical exam deferred due to type of encounter  Assessment and Plan:  Pregnancy: G1P0 at [redacted]w[redacted]d 1. Supervision of high risk pregnancy, antepartum Stable Pt appt was changed for face to face d/t diarrhea. Pt provided information for Covid testing  2. Insulin controlled gestational diabetes mellitus (GDM) in third trimester CBG's in goal range Continue with insulin, 12 units q bedtime Growth scan next week Will need to schedule IOl at next visit  3. Multigravida of advanced maternal age in third  trimester Negative testing  Preterm labor symptoms and general obstetric precautions including but not limited to vaginal bleeding, contractions, leaking of fluid and fetal movement were reviewed in detail with the patient.  I discussed the assessment and treatment plan with the patient. The patient was provided an opportunity to ask questions and all were answered. The patient agreed with the plan and demonstrated an understanding of the instructions. The patient was advised to call back or seek an in-person office evaluation/go to [facility] at [facility] for any urgent or concerning symptoms. Please refer to After Visit Summary for other counseling recommendations.   I provided 8 minutes of non-face-to-face time during this encounter.  Return in about 1 week (around 07/21/2019) for OB visit, face to face, MD provider, discuss IOL.  Future Appointments  Date Time Provider Department Center  07/21/2019  8:45 AM [facility]-M[facility] NURSE [facility]-MFC MFC-US  07/21/2019  8:45 AM WH-MFC [place] 2 WH-MFCUS MFC-US  07/21/2019 10:55 AM Hermina Staggers, MD WOC-WOCA WOC    Hermina Staggers, MD Center for [facility], [facility]

## 2019-07-14 NOTE — Progress Notes (Signed)
BRx Glucose Readings:     

## 2019-07-14 NOTE — Progress Notes (Signed)
Pt not home to take BP, asked if she can take once she gets home & record in BRx. Pt verbalized understanding.

## 2019-07-15 ENCOUNTER — Ambulatory Visit: Payer: Medicaid Other

## 2019-07-21 ENCOUNTER — Ambulatory Visit (HOSPITAL_COMMUNITY): Payer: Medicaid Other | Admitting: *Deleted

## 2019-07-21 ENCOUNTER — Other Ambulatory Visit: Payer: Self-pay

## 2019-07-21 ENCOUNTER — Ambulatory Visit (INDEPENDENT_AMBULATORY_CARE_PROVIDER_SITE_OTHER): Payer: Medicaid Other | Admitting: Obstetrics and Gynecology

## 2019-07-21 ENCOUNTER — Encounter: Payer: Self-pay | Admitting: Obstetrics and Gynecology

## 2019-07-21 ENCOUNTER — Encounter (HOSPITAL_COMMUNITY): Payer: Self-pay

## 2019-07-21 ENCOUNTER — Ambulatory Visit (HOSPITAL_COMMUNITY)
Admission: RE | Admit: 2019-07-21 | Discharge: 2019-07-21 | Disposition: A | Discharge status: Home / Self Care | Payer: Medicaid Other | Source: Ambulatory Visit | Attending: Obstetrics and Gynecology | Admitting: Obstetrics and Gynecology

## 2019-07-21 VITALS — BP 122/80 | HR 76 | Wt 158.1 lb

## 2019-07-21 DIAGNOSIS — Z362 Encounter for other antenatal screening follow-up: Secondary | ICD-10-CM | POA: Diagnosis not present

## 2019-07-21 DIAGNOSIS — O0990 Supervision of high risk pregnancy, unspecified, unspecified trimester: Secondary | ICD-10-CM

## 2019-07-21 DIAGNOSIS — O09523 Supervision of elderly multigravida, third trimester: Secondary | ICD-10-CM

## 2019-07-21 DIAGNOSIS — O24414 Gestational diabetes mellitus in pregnancy, insulin controlled: Secondary | ICD-10-CM

## 2019-07-21 DIAGNOSIS — Z3A38 38 weeks gestation of pregnancy: Secondary | ICD-10-CM

## 2019-07-21 DIAGNOSIS — O09513 Supervision of elderly primigravida, third trimester: Secondary | ICD-10-CM | POA: Diagnosis not present

## 2019-07-21 DIAGNOSIS — O09519 Supervision of elderly primigravida, unspecified trimester: Secondary | ICD-10-CM | POA: Insufficient documentation

## 2019-07-21 NOTE — Patient Instructions (Signed)

## 2019-07-21 NOTE — Progress Notes (Signed)
Pt records glucose on paper but did not bring.

## 2019-07-21 NOTE — Progress Notes (Signed)
Subjective:  Melissa Solomon is a 39 y.o. G1P0 at [redacted]w[redacted]d being seen today for ongoing prenatal care.  She is currently monitored for the following issues for this high-risk pregnancy and has Gestational diabetes mellitus (GDM); Supervision of high risk pregnancy, antepartum; and AMA (advanced maternal age) multigravida 35+ on their problem list.  Patient reports general discomforts of pregnancy.  Contractions: Not present. Vag. Bleeding: None.  Movement: Present. Denies leaking of fluid.   The following portions of the patient's history were reviewed and updated as appropriate: allergies, current medications, past family history, past medical history, past social history, past surgical history and problem list. Problem list updated.  Objective:   Vitals:   07/21/19 1125  BP: 122/80  Pulse: 76  Weight: 158 lb 1.6 oz (71.7 kg)    Fetal Status: Fetal Heart Rate (bpm): 138   Movement: Present     General:  Alert, oriented and cooperative. Patient is in no acute distress.  Skin: Skin is warm and dry. No rash noted.   Cardiovascular: Normal heart rate noted  Respiratory: Normal respiratory effort, no problems with respiration noted  Abdomen: Soft, gravid, appropriate for gestational age. Pain/Pressure: Present     Pelvic:  Cervical exam performed        Extremities: Normal range of motion.  Edema: Trace  Mental Status: Normal mood and affect. Normal behavior. Normal judgment and thought content.   Urinalysis:      Assessment and Plan:  Pregnancy: G1P0 at [redacted]w[redacted]d  1. Supervision of high risk pregnancy, antepartum Labor precautions  2. Insulin controlled gestational diabetes mellitus (GDM) in third trimester Pt reports CBG's in goal range Did not bring readings Will continue with 12 units of insulin qhs BPP today 8/8 Growth 65 % on 06/30/19 IOL scheduled for 07/28/19 IOL reviewed with pt  3. Multigravida of advanced maternal age in third trimester Negative testing  Term labor  symptoms and general obstetric precautions including but not limited to vaginal bleeding, contractions, leaking of fluid and fetal movement were reviewed in detail with the patient. Please refer to After Visit Summary for other counseling recommendations.  No follow-ups on file.   Hermina Staggers, MD

## 2019-07-22 ENCOUNTER — Telehealth (HOSPITAL_COMMUNITY): Payer: Self-pay | Admitting: *Deleted

## 2019-07-22 ENCOUNTER — Encounter (HOSPITAL_COMMUNITY): Payer: Self-pay | Admitting: *Deleted

## 2019-07-22 NOTE — Telephone Encounter (Signed)
Interpreter number [id number] 

## 2019-07-25 ENCOUNTER — Other Ambulatory Visit (HOSPITAL_COMMUNITY): Payer: Self-pay | Admitting: Student

## 2019-07-26 ENCOUNTER — Other Ambulatory Visit (HOSPITAL_COMMUNITY)
Admission: RE | Admit: 2019-07-26 | Discharge: 2019-07-26 | Disposition: A | Discharge status: Home / Self Care | Payer: Medicaid Other | Source: Ambulatory Visit | Attending: Family Medicine | Admitting: Family Medicine

## 2019-07-26 DIAGNOSIS — Z01812 Encounter for preprocedural laboratory examination: Secondary | ICD-10-CM | POA: Diagnosis present

## 2019-07-26 DIAGNOSIS — U071 COVID-19: Secondary | ICD-10-CM | POA: Insufficient documentation

## 2019-07-26 LAB — SARS CORONAVIRUS 2 (TAT 6-24 HRS): SARS Coronavirus 2: POSITIVE — AB

## 2019-07-27 ENCOUNTER — Telehealth: Payer: Self-pay

## 2019-07-27 NOTE — Progress Notes (Signed)
Patient with positive covid result. Contacted MD and informed of result.- L&D informed as well.

## 2019-07-27 NOTE — Telephone Encounter (Signed)
Called Pt to advise of testing positive for Covid, and informed her of support person remaining with her during induction. Patient verbalized understanding.

## 2019-07-27 NOTE — Telephone Encounter (Signed)
-----   Message from Gerrit Heck, [place] sent at 07/27/2019  9:15 AM EDT ----- Regarding: FW: COVID Positive Induction, on 04/08, Melissa Solomon Hello [name]!  Please call patient and inform of positive Covid result.  She is scheduled for IOL on 4/8 and needs to be informed that her support person will have to remain at bedside throughout the induction process. She is Spanish speaking per the chart, but no notes reflect interpreter use.   Thanks,  Shanda Bumps  ----- Message ----- From: Chanda Busing Sent: 07/27/2019   8:44 AM EDT To: Gerrit Heck, CNM Subject: COVID Positive Induction, on 04/08, [name]#  Good morning,  Melissa Solomon tested positive for COVID on 07/27/19. Message received from [facility] test site.  MRN [id number]

## 2019-07-28 ENCOUNTER — Encounter (HOSPITAL_COMMUNITY): Payer: Self-pay | Admitting: Obstetrics and Gynecology

## 2019-07-28 ENCOUNTER — Inpatient Hospital Stay (HOSPITAL_COMMUNITY)
Admission: AD | Admit: 2019-07-28 | Discharge: 2019-08-01 | DRG: 786 | Disposition: A | Discharge status: Home / Self Care | Payer: Medicaid Other | Attending: Obstetrics & Gynecology | Admitting: Obstetrics & Gynecology

## 2019-07-28 ENCOUNTER — Inpatient Hospital Stay (HOSPITAL_COMMUNITY): Payer: Medicaid Other

## 2019-07-28 DIAGNOSIS — O09529 Supervision of elderly multigravida, unspecified trimester: Secondary | ICD-10-CM

## 2019-07-28 DIAGNOSIS — Z3490 Encounter for supervision of normal pregnancy, unspecified, unspecified trimester: Secondary | ICD-10-CM

## 2019-07-28 DIAGNOSIS — Z3A39 39 weeks gestation of pregnancy: Secondary | ICD-10-CM

## 2019-07-28 DIAGNOSIS — U071 COVID-19: Secondary | ICD-10-CM | POA: Diagnosis present

## 2019-07-28 DIAGNOSIS — Z98891 History of uterine scar from previous surgery: Secondary | ICD-10-CM

## 2019-07-28 DIAGNOSIS — O9852 Other viral diseases complicating childbirth: Secondary | ICD-10-CM | POA: Diagnosis present

## 2019-07-28 DIAGNOSIS — Z603 Acculturation difficulty: Secondary | ICD-10-CM | POA: Diagnosis not present

## 2019-07-28 DIAGNOSIS — O24424 Gestational diabetes mellitus in childbirth, insulin controlled: Principal | ICD-10-CM | POA: Diagnosis present

## 2019-07-28 DIAGNOSIS — O24414 Gestational diabetes mellitus in pregnancy, insulin controlled: Secondary | ICD-10-CM

## 2019-07-28 DIAGNOSIS — O24419 Gestational diabetes mellitus in pregnancy, unspecified control: Secondary | ICD-10-CM | POA: Diagnosis present

## 2019-07-28 DIAGNOSIS — O0990 Supervision of high risk pregnancy, unspecified, unspecified trimester: Secondary | ICD-10-CM

## 2019-07-28 DIAGNOSIS — O09523 Supervision of elderly multigravida, third trimester: Secondary | ICD-10-CM

## 2019-07-28 LAB — GLUCOSE, CAPILLARY
Glucose-Capillary: 103 mg/dL — ABNORMAL HIGH (ref 70–99)
Glucose-Capillary: 106 mg/dL — ABNORMAL HIGH (ref 70–99)
Glucose-Capillary: 107 mg/dL — ABNORMAL HIGH (ref 70–99)
Glucose-Capillary: 127 mg/dL — ABNORMAL HIGH (ref 70–99)
Glucose-Capillary: 96 mg/dL (ref 70–99)

## 2019-07-28 LAB — CBC
HCT: 34.3 % — ABNORMAL LOW (ref 36.0–46.0)
Hemoglobin: 11.1 g/dL — ABNORMAL LOW (ref 12.0–15.0)
MCH: 28.6 pg (ref 26.0–34.0)
MCHC: 32.4 g/dL (ref 30.0–36.0)
MCV: 88.4 fL (ref 80.0–100.0)
Platelets: 320 10*3/uL (ref 150–400)
RBC: 3.88 MIL/uL (ref 3.87–5.11)
RDW: 14.6 % (ref 11.5–15.5)
WBC: 8.4 10*3/uL (ref 4.0–10.5)
nRBC: 0 % (ref 0.0–0.2)

## 2019-07-28 LAB — ABO/RH: ABO/RH(D): B POS

## 2019-07-28 LAB — TYPE AND SCREEN
ABO/RH(D): B POS
Antibody Screen: NEGATIVE

## 2019-07-28 LAB — RPR: RPR Ser Ql: NONREACTIVE

## 2019-07-28 MED ORDER — TERBUTALINE SULFATE 1 MG/ML IJ SOLN: 0.2500 mg | Freq: Once | INTRAMUSCULAR | Status: DC | PRN

## 2019-07-28 MED ORDER — MISOPROSTOL 50MCG HALF TABLET
50.0000 ug | ORAL_TABLET | ORAL | Status: DC | PRN
  Filled 2019-07-28: qty 1

## 2019-07-28 MED ORDER — MISOPROSTOL 50MCG HALF TABLET: 50.0000 ug | ORAL_TABLET | ORAL | Status: DC | PRN

## 2019-07-28 MED ORDER — MISOPROSTOL 50MCG HALF TABLET
ORAL_TABLET | ORAL | Status: AC
  Administered 2019-07-28: 50 ug via ORAL
  Filled 2019-07-28: qty 1

## 2019-07-28 MED ORDER — OXYCODONE-ACETAMINOPHEN 5-325 MG PO TABS: 1.0000 | ORAL_TABLET | ORAL | Status: DC | PRN

## 2019-07-28 MED ORDER — OXYTOCIN 40 UNITS IN NORMAL SALINE INFUSION - SIMPLE MED
2.5000 [IU]/h | INTRAVENOUS | Status: DC
  Filled 2019-07-28: qty 1000

## 2019-07-28 MED ORDER — OXYCODONE-ACETAMINOPHEN 5-325 MG PO TABS: 2.0000 | ORAL_TABLET | ORAL | Status: DC | PRN

## 2019-07-28 MED ORDER — OXYTOCIN BOLUS FROM INFUSION: 500.0000 mL | Freq: Once | INTRAVENOUS | Status: DC

## 2019-07-28 MED ORDER — LIDOCAINE HCL (PF) 1 % IJ SOLN
30.0000 mL | INTRAMUSCULAR | Status: AC | PRN
  Administered 2019-07-29: 10 mL via SUBCUTANEOUS

## 2019-07-28 MED ORDER — ACETAMINOPHEN 325 MG PO TABS
650.0000 mg | ORAL_TABLET | ORAL | Status: DC | PRN
  Administered 2019-07-29: 650 mg via ORAL
  Filled 2019-07-28: qty 2

## 2019-07-28 MED ORDER — OXYTOCIN 40 UNITS IN NORMAL SALINE INFUSION - SIMPLE MED
1.0000 m[IU]/min | INTRAVENOUS | Status: DC
  Administered 2019-07-28: 14:00:00 2 m[IU]/min via INTRAVENOUS
  Administered 2019-07-29: 8 m[IU]/min via INTRAVENOUS
  Administered 2019-07-29: 08:00:00 14 m[IU]/min via INTRAVENOUS
  Administered 2019-07-29: 11:00:00 2 m[IU]/min via INTRAVENOUS
  Administered 2019-07-29: 12:00:00 4 m[IU]/min via INTRAVENOUS
  Administered 2019-07-29: 10 m[IU]/min via INTRAVENOUS
  Administered 2019-07-29: 14:00:00 6 m[IU]/min via INTRAVENOUS

## 2019-07-28 MED ORDER — MISOPROSTOL 25 MCG QUARTER TABLET
ORAL_TABLET | ORAL | Status: AC
  Filled 2019-07-28: qty 1

## 2019-07-28 MED ORDER — LACTATED RINGERS IV SOLN: 500.0000 mL | INTRAVENOUS | Status: DC | PRN

## 2019-07-28 MED ORDER — SOD CITRATE-CITRIC ACID 500-334 MG/5ML PO SOLN
30.0000 mL | ORAL | Status: DC | PRN
  Administered 2019-07-29: 30 mL via ORAL
  Filled 2019-07-28: qty 30

## 2019-07-28 MED ORDER — ONDANSETRON HCL 4 MG/2ML IJ SOLN: 4.0000 mg | Freq: Four times a day (QID) | INTRAMUSCULAR | Status: DC | PRN

## 2019-07-28 MED ORDER — FENTANYL CITRATE (PF) 100 MCG/2ML IJ SOLN
50.0000 ug | INTRAMUSCULAR | Status: DC | PRN
  Administered 2019-07-28 (×6): 100 ug via INTRAVENOUS
  Administered 2019-07-28: 50 ug via INTRAVENOUS
  Administered 2019-07-29: 100 ug via INTRAVENOUS
  Filled 2019-07-28 (×9): qty 2

## 2019-07-28 MED ORDER — LACTATED RINGERS IV SOLN: INTRAVENOUS | Status: DC

## 2019-07-28 NOTE — Progress Notes (Signed)
LABOR PROGRESS NOTE  Melissa Solomon is a 39 y.o. G1P0 at [redacted]w[redacted]d  admitted for IOL for GDMA2.  Subjective: Uncomfortable w contractions  Objective: BP 134/72   Pulse 83   Temp 98.9 F (37.2 C) (Oral)   Resp 18   Ht 4\' 10"  (1.473 m)   Wt 72.5 kg   LMP 10/28/2018 (Exact Date)   BMI 33.40 kg/m  or  Vitals:   07/28/19 1518 07/28/19 1745 07/28/19 2024 07/28/19 2108  BP: 120/66 131/72 120/66 134/72  Pulse: 64 76 79 83  Resp: 20 18 18 18   Temp:      TempSrc:      Weight:      Height:         Dilation: 5 Effacement (%): 100 Cervical Position: Middle Station: -2 Presentation: Vertex Exam by:: RNC FHT: baseline rate 125, moderate varibility, +acel, variable decel Toco: q2-3 min  Labs: Lab Results  Component Value Date   WBC 8.4 07/28/2019   HGB 11.1 (L) 07/28/2019   HCT 34.3 (L) 07/28/2019   MCV 88.4 07/28/2019   PLT 320 07/28/2019    Patient Active Problem List   Diagnosis Date Noted  . Gestational diabetes 07/28/2019  . Lab test positive for detection of COVID-19 virus 07/28/2019  . AMA (advanced maternal age) multigravida 35+ 05/05/2019  . Gestational diabetes mellitus (GDM) 04/11/2019  . Supervision of high risk pregnancy, antepartum 04/11/2019    Assessment / Plan: 39 y.o. G1P0 at [redacted]w[redacted]d here for IOL for GDMA2.  Labor: No cervical change from last exam, AROM for scant fluid this exam, cont pitocin augmentation.  Fetal Wellbeing:  Cat II for new variables since AROM but overall reassuring with moderate variability and accels Pain Control:  Desires unmedicated birth GBS: negative Anticipated MOD:  SVD  GDMA2: on QHS NPH prior to admission, CBG q4h latent labor and q2h active labor, have been well controlled (103>106>96)  24, MD/MPH OB Fellow  07/28/2019, 9:58 PM

## 2019-07-28 NOTE — Progress Notes (Signed)
Labor Progress Note Melissa Solomon is a 39 y.o. G1P0 at [redacted]w[redacted]d presented for IOL for GDM.    S: Patient doing and is tolerating contractions. Expressed no concerns.   O:  BP (!) 154/69   Pulse 78   Temp 98.9 F (37.2 C) (Oral)   Resp 20   Ht 4\' 10"  (1.473 m)   Wt 72.5 kg   LMP 10/28/2018 (Exact Date)   BMI 33.40 kg/m   EFM: 120 / moderate viability/ pos accels, no decels  TOCO: 2-3 minutes   CVE: Dilation: 3 Effacement (%): 90 Cervical Position: Anterior Station: Ballotable Presentation: Vertex Exam by:: [email or web address] CNM   A&P: 39 y.o. G1P0 [redacted]w[redacted]d for IOL for GDM.  #Labor: Progressing well s/p Cytotec x1.  Patient agreeable to pitocin.  Start pit 2x2.  #Pain: per request  #FWB: Cat 1  #GBS negative #GDM:  Most recent CBG 106. Continue to monitor.   [place] Nishi, DO 2:05 PM

## 2019-07-28 NOTE — Progress Notes (Signed)
Labor Progress Note Melissa Solomon is a 39 y.o. G1P0 at [redacted]w[redacted]d presented for IOL   S: Feeling contractions and is tired. Pt reports no Covid symptoms. Actively standing and pacing in room.   O:  BP 120/66   Pulse 64   Temp 98.9 F (37.2 C) (Oral)   Resp 20   Ht 4\' 10"  (1.473 m)   Wt 72.5 kg   LMP 10/28/2018 (Exact Date)   BMI 33.40 kg/m  EFM: 120/good variability/postive accelerations/ negative decelerations    CVE: Dilation: 4.5 Effacement (%): 100 Cervical Position: Anterior Station: -2 Presentation: Vertex Exam by:: [email or web address] RNC   A&P: 39 y.o. G1P0 [redacted]w[redacted]d IOL #Labor: Progressing. Has had two doses of Pitocin  #Pain: per pt request #FWB: Cat 1 #GBS negative #Covid positive: asymptomatic  #Gestational Diabetes: recent CBG 96, continue to monitor with q4h CBGs   [redacted]w[redacted]d, MS3 5:47 PM     RESIDENT ATTESTATION OF STUDENT NOTE   I personally evaluated this patient along with the student, and verified all aspects of the history, physical exam, and medical decision making as documented by the student. I agree with the student's documentation and have made all necessary edits.   Laural Benes, DO PGY-1, [facility] 07/28/2019 6:37 PM

## 2019-07-28 NOTE — H&P (Addendum)
OBSTETRIC ADMISSION HISTORY AND PHYSICAL  Melissa Solomon is a 39 y.o. female G1P0 with IUP at 70w0dpresenting for IOL for gestational diabetes (A2). Reported maroon colored fluid loss last night but none since. She reports +FMs, no VB, no blurry vision, headaches or peripheral edema, and RUQ pain.  She plans on both breast and bottle feeding. She is undecided for birth control. [facility] [place] --->  Estimated Date of Delivery: 08/04/19  Sono:    '@[redacted]w[redacted]d' , CWD, normal anatomy, cephalic presentation, EFW 6lb (2725g) 65%  Prenatal History/Complications: Gestational Diabetes (metformin / insulin)  AMA Language barrier Covid test positive (screening)   Past Medical History: Past Medical History:  Diagnosis Date  . Gestational diabetes   . 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 NO PAST SURGERIES      Obstetrical History: OB History    Gravida  1   Para      Term      Preterm      AB      Living  0     SAB      TAB      Ectopic      Multiple      Live Births              Social History Social History   Socioeconomic History  . Marital status: Single    Spouse name: Not on file  . Number of children: Not on file  . Years of education: Not on file  . Highest education level: Not on file  Occupational History  . Not on file  Tobacco Use  . Smoking status: Never Smoker  . Smokeless tobacco: Never Used  Substance and Sexual Activity  . Alcohol use: Never  . Drug use: Never  . Sexual activity: Not on file  Other Topics Concern  . Not on file  Social History Narrative  . Not on file   Social Determinants of Health   Financial Resource Strain:   . Difficulty of Paying Living Expenses:   Food Insecurity: No Food Insecurity  . Worried About RCharity fundraiserin the Last Year: Never true  . Ran Out of  Food in the Last Year: Never true  Transportation Needs: No Transportation Needs  . Lack of Transportation (Medical): No  . Lack of Transportation (Non-Medical): No  Physical Activity:   . Days of Exercise per Week:   . Minutes of Exercise per Session:   Stress:   . Feeling of Stress :   Social Connections:   . Frequency of Communication with Friends and Family:   . Frequency of Social Gatherings with Friends and Family:   . Attends Religious Services:   . Active Member of Clubs or Organizations:   . Attends CArchivistMeetings:   .Marland KitchenMarital Status:     Family History: Family History  Problem Relation Age of Onset  . Hypertension Mother     Allergies: No Known Allergies  Medications Prior to Admission  Medication Sig Dispense Refill Last Dose  . Accu-Chek Softclix Lancets lancets Use as instructed 100 each 12   . Blood Glucose Monitoring Suppl (ACCU-CHEK NANO SMARTVIEW) w/Device KIT 1 kit by Subdermal route as directed. Check blood sugars for fasting, and two hours after breakfast, lunch and dinner (4 checks daily) 1 kit  0   . glucose blood test strip Use as instructed 100 each 12   . insulin NPH Human (NOVOLIN N) 100 UNIT/ML injection Inject 0.12 mLs (12 Units total) into the skin at bedtime. 10 mL 3   . Prenat-Fe Poly-Methfol-FA-DHA (VITAFOL ULTRA) 29-0.6-0.4-200 MG CAPS Take 1 tablet by mouth daily. 30 capsule 12      Review of Systems   All systems reviewed and negative except as stated in HPI  Blood pressure 122/73, pulse 74, resp. rate 18, height '4\' 10"'  (1.473 m), weight 72.5 kg, last menstrual period 10/28/2018. General appearance: alert and no distress Lungs: clear to auscultation bilaterally Heart: regular rate and rhythm Abdomen: soft, non-tender; bowel sounds normal Pelvic: gravid uterus  Extremities: Homans sign is negative, no sign of DVT Presentation: cephalic confirmed by ultrasound  Fetal monitoringBaseline: 130 bpm, Variability: Good {> 6  bpm), Accelerations: Reactive and Decelerations: Absent Uterine activityFrequency:  Irregular     Prenatal labs: ABO, Rh: B/Positive/-- (12/03 0000) Antibody: Negative (12/03 0000) Rubella: Immune (12/03 0000) RPR: Non Reactive (01/14 1222)  HBsAg: Negative (12/03 0000)  HIV: Non Reactive (01/14 1222)  GBS: Negative/-- (03/17 1635)  3 hr Glucola abnormal (99, 217, 152, 137)  Anatomy US normal (inadequate view of the heart)  Prenatal Transfer Tool  Maternal Diabetes: Yes:  Diabetes Type:  Insulin/Medication controlled Genetic Screening: Normal (Negative Quad screen)  Maternal Ultrasounds/Referrals: Normal Fetal Ultrasounds or other Referrals:  None Maternal Substance Abuse:  No Significant Maternal Medications:  None Significant Maternal Lab Results: Group B Strep negative  Results for orders placed or performed during the hospital encounter of 07/28/19 (from the past 24 hour(s))  Glucose, capillary   Collection Time: 07/28/19  8:02 AM  Result Value Ref Range   Glucose-Capillary 103 (H) 70 - 99 mg/dL    Patient Active Problem List   Diagnosis Date Noted  . Gestational diabetes 07/28/2019  . AMA (advanced maternal age) multigravida 35+ 05/05/2019  . Gestational diabetes mellitus (GDM) 04/11/2019  . Supervision of high risk pregnancy, antepartum 04/11/2019    Assessment/Plan:  Melissa Solomon is a 39 y.o. G1P0 at 47w0dhere for IOL for A2GDM.   #Labor: Admit to L&D.  Discussed risks and benefits of labor induction.  Patient agreeable to Cytotec. Reassess in 3-4 hours.  #Pain: Per request  #FWB: Cat 1  #ID:  None #MOF: breast and bottle  #MOC: undecided  #Circ:  NA, girl  #GDM: Insulin/metformin controlled.  Patient did not take insulin last night. Monitor q4h CBGs   VLyndee Hensen DO  07/28/2019, 10:14 AM   Midwife attestation: I have seen and examined this patient; I agree with above documentation in the resident's note.   PE: Gen: calm comfortable,  NAD Resp: normal effort and rate Abd: gravid Vtx confirmed by  BSUS ROS, labs, PMH reviewed  Assessment/Plan: Melissa Solomon a 39y.o. G1P0 here for IOL for GDM on Insulin Admit to LD Labor: latent FWB: Cat I GBS neg CBG q4, consider endot tool if indicated Cervical ripening with Cytotec Anticipate SVD  Video interpreter used for encounter  MJulianne Handler CNM  07/28/2019, 10:29 AM

## 2019-07-29 ENCOUNTER — Encounter (HOSPITAL_COMMUNITY)
Admission: AD | Disposition: A | Discharge status: Home / Self Care | Payer: Self-pay | Source: Home / Self Care | Attending: Obstetrics & Gynecology

## 2019-07-29 ENCOUNTER — Encounter (HOSPITAL_COMMUNITY): Payer: Self-pay | Admitting: Obstetrics and Gynecology

## 2019-07-29 ENCOUNTER — Inpatient Hospital Stay (HOSPITAL_COMMUNITY): Payer: Medicaid Other | Admitting: Anesthesiology

## 2019-07-29 DIAGNOSIS — Z3490 Encounter for supervision of normal pregnancy, unspecified, unspecified trimester: Secondary | ICD-10-CM

## 2019-07-29 DIAGNOSIS — Z3A39 39 weeks gestation of pregnancy: Secondary | ICD-10-CM

## 2019-07-29 DIAGNOSIS — Z603 Acculturation difficulty: Secondary | ICD-10-CM

## 2019-07-29 DIAGNOSIS — O24414 Gestational diabetes mellitus in pregnancy, insulin controlled: Secondary | ICD-10-CM

## 2019-07-29 DIAGNOSIS — O9852 Other viral diseases complicating childbirth: Secondary | ICD-10-CM

## 2019-07-29 DIAGNOSIS — U071 COVID-19: Secondary | ICD-10-CM

## 2019-07-29 LAB — GLUCOSE, CAPILLARY
Glucose-Capillary: 154 mg/dL — ABNORMAL HIGH (ref 70–99)
Glucose-Capillary: 118 mg/dL — ABNORMAL HIGH (ref 70–99)
Glucose-Capillary: 121 mg/dL — ABNORMAL HIGH (ref 70–99)
Glucose-Capillary: 109 mg/dL — ABNORMAL HIGH (ref 70–99)
Glucose-Capillary: 103 mg/dL — ABNORMAL HIGH (ref 70–99)
Glucose-Capillary: 121 mg/dL — ABNORMAL HIGH (ref 70–99)

## 2019-07-29 SURGERY — Surgical Case
Anesthesia: Epidural

## 2019-07-29 MED ORDER — SODIUM CHLORIDE (PF) 0.9 % IJ SOLN
INTRAMUSCULAR | Status: DC | PRN
  Administered 2019-07-29: 12 mL/h via EPIDURAL

## 2019-07-29 MED ORDER — MISOPROSTOL 200 MCG PO TABS: 1000.0000 ug | ORAL_TABLET | Freq: Once | ORAL | Status: DC

## 2019-07-29 MED ORDER — SODIUM CHLORIDE 0.9% FLUSH: 3.0000 mL | INTRAVENOUS | Status: DC | PRN

## 2019-07-29 MED ORDER — NALBUPHINE HCL 10 MG/ML IJ SOLN: 5.0000 mg | INTRAMUSCULAR | Status: DC | PRN

## 2019-07-29 MED ORDER — ENOXAPARIN SODIUM 40 MG/0.4ML ~~LOC~~ SOLN
40.0000 mg | SUBCUTANEOUS | Status: DC
  Administered 2019-07-30 – 2019-08-01 (×3): 40 mg via SUBCUTANEOUS
  Filled 2019-07-29 (×3): qty 0.4

## 2019-07-29 MED ORDER — NALOXONE HCL 4 MG/10ML IJ SOLN
1.0000 ug/kg/h | INTRAVENOUS | Status: DC | PRN
  Filled 2019-07-29: qty 5

## 2019-07-29 MED ORDER — EPHEDRINE 5 MG/ML INJ: 10.0000 mg | INTRAVENOUS | Status: DC | PRN

## 2019-07-29 MED ORDER — PHENYLEPHRINE 40 MCG/ML (10ML) SYRINGE FOR IV PUSH (FOR BLOOD PRESSURE SUPPORT): 80.0000 ug | PREFILLED_SYRINGE | INTRAVENOUS | Status: DC | PRN

## 2019-07-29 MED ORDER — ACETAMINOPHEN 160 MG/5ML PO SOLN: 1000.0000 mg | Freq: Once | ORAL | Status: DC

## 2019-07-29 MED ORDER — LACTATED RINGERS IV SOLN: INTRAVENOUS | Status: DC | PRN

## 2019-07-29 MED ORDER — WITCH HAZEL-GLYCERIN EX PADS: 1.0000 "application " | MEDICATED_PAD | CUTANEOUS | Status: DC | PRN

## 2019-07-29 MED ORDER — OXYTOCIN 10 UNIT/ML IJ SOLN
INTRAMUSCULAR | Status: DC | PRN
  Administered 2019-07-29: 40 [IU] via INTRAMUSCULAR

## 2019-07-29 MED ORDER — IBUPROFEN 800 MG PO TABS
800.0000 mg | ORAL_TABLET | Freq: Four times a day (QID) | ORAL | Status: DC
  Administered 2019-07-30 – 2019-08-01 (×7): 800 mg via ORAL
  Filled 2019-07-29 (×7): qty 1

## 2019-07-29 MED ORDER — SODIUM CHLORIDE 0.9 % IV SOLN
500.0000 mg | Freq: Once | INTRAVENOUS | Status: AC
  Administered 2019-07-29: 500 mg via INTRAVENOUS

## 2019-07-29 MED ORDER — METOCLOPRAMIDE HCL 5 MG/ML IJ SOLN
INTRAMUSCULAR | Status: AC
  Filled 2019-07-29: qty 2

## 2019-07-29 MED ORDER — FENTANYL CITRATE (PF) 100 MCG/2ML IJ SOLN
INTRAMUSCULAR | Status: DC | PRN
  Administered 2019-07-29: 100 ug via EPIDURAL

## 2019-07-29 MED ORDER — OXYCODONE HCL 5 MG PO TABS
5.0000 mg | ORAL_TABLET | ORAL | Status: DC | PRN
  Administered 2019-07-30 – 2019-08-01 (×2): 5 mg via ORAL
  Filled 2019-07-29 (×2): qty 1

## 2019-07-29 MED ORDER — LIDOCAINE-EPINEPHRINE (PF) 2 %-1:200000 IJ SOLN
INTRAMUSCULAR | Status: DC | PRN
  Administered 2019-07-29 (×2): 5 mL via EPIDURAL

## 2019-07-29 MED ORDER — DIPHENHYDRAMINE HCL 25 MG PO CAPS: 25.0000 mg | ORAL_CAPSULE | Freq: Four times a day (QID) | ORAL | Status: DC | PRN

## 2019-07-29 MED ORDER — PROMETHAZINE HCL 25 MG/ML IJ SOLN: 6.2500 mg | INTRAMUSCULAR | Status: DC | PRN

## 2019-07-29 MED ORDER — OXYTOCIN 40 UNITS IN NORMAL SALINE INFUSION - SIMPLE MED: 2.5000 [IU]/h | INTRAVENOUS | Status: AC

## 2019-07-29 MED ORDER — MORPHINE SULFATE (PF) 0.5 MG/ML IJ SOLN
INTRAMUSCULAR | Status: DC | PRN
  Administered 2019-07-29: 3 mg via EPIDURAL

## 2019-07-29 MED ORDER — LIDOCAINE-EPINEPHRINE (PF) 2 %-1:200000 IJ SOLN
INTRAMUSCULAR | Status: AC
  Filled 2019-07-29: qty 10

## 2019-07-29 MED ORDER — MISOPROSTOL 200 MCG PO TABS
ORAL_TABLET | ORAL | Status: AC
  Filled 2019-07-29: qty 5

## 2019-07-29 MED ORDER — SODIUM CHLORIDE 0.9 % IV SOLN: INTRAVENOUS | Status: DC | PRN

## 2019-07-29 MED ORDER — KETOROLAC TROMETHAMINE 30 MG/ML IJ SOLN: 30.0000 mg | Freq: Four times a day (QID) | INTRAMUSCULAR | Status: AC | PRN

## 2019-07-29 MED ORDER — ACETAMINOPHEN 500 MG PO TABS
1000.0000 mg | ORAL_TABLET | Freq: Four times a day (QID) | ORAL | Status: DC
  Administered 2019-07-30 – 2019-08-01 (×9): 1000 mg via ORAL
  Filled 2019-07-29 (×8): qty 2

## 2019-07-29 MED ORDER — SOD CITRATE-CITRIC ACID 500-334 MG/5ML PO SOLN: 30.0000 mL | ORAL | Status: DC

## 2019-07-29 MED ORDER — ZOLPIDEM TARTRATE 5 MG PO TABS: 5.0000 mg | ORAL_TABLET | Freq: Every evening | ORAL | Status: DC | PRN

## 2019-07-29 MED ORDER — ONDANSETRON HCL 4 MG/2ML IJ SOLN
4.0000 mg | Freq: Three times a day (TID) | INTRAMUSCULAR | Status: DC | PRN
  Administered 2019-07-30: 4 mg via INTRAVENOUS
  Filled 2019-07-29: qty 2

## 2019-07-29 MED ORDER — NALOXONE HCL 0.4 MG/ML IJ SOLN: 0.4000 mg | INTRAMUSCULAR | Status: DC | PRN

## 2019-07-29 MED ORDER — FENTANYL CITRATE (PF) 100 MCG/2ML IJ SOLN: 25.0000 ug | INTRAMUSCULAR | Status: DC | PRN

## 2019-07-29 MED ORDER — NALBUPHINE HCL 10 MG/ML IJ SOLN: 5.0000 mg | Freq: Once | INTRAMUSCULAR | Status: DC | PRN

## 2019-07-29 MED ORDER — ONDANSETRON HCL 4 MG/2ML IJ SOLN
INTRAMUSCULAR | Status: AC
  Filled 2019-07-29: qty 2

## 2019-07-29 MED ORDER — DIPHENHYDRAMINE HCL 50 MG/ML IJ SOLN: 12.5000 mg | INTRAMUSCULAR | Status: DC | PRN

## 2019-07-29 MED ORDER — SENNOSIDES-DOCUSATE SODIUM 8.6-50 MG PO TABS
2.0000 | ORAL_TABLET | ORAL | Status: DC
  Administered 2019-07-30 – 2019-07-31 (×2): 2 via ORAL
  Filled 2019-07-29 (×2): qty 2

## 2019-07-29 MED ORDER — ACETAMINOPHEN 500 MG PO TABS: 1000.0000 mg | ORAL_TABLET | Freq: Once | ORAL | Status: DC

## 2019-07-29 MED ORDER — METHYLERGONOVINE MALEATE 0.2 MG/ML IJ SOLN
INTRAMUSCULAR | Status: DC | PRN
  Administered 2019-07-29: .2 mg via INTRAMUSCULAR

## 2019-07-29 MED ORDER — SCOPOLAMINE 1 MG/3DAYS TD PT72
MEDICATED_PATCH | TRANSDERMAL | Status: AC
  Filled 2019-07-29: qty 1

## 2019-07-29 MED ORDER — MISOPROSTOL 200 MCG PO TABS
1000.0000 ug | ORAL_TABLET | Freq: Once | ORAL | Status: AC
  Administered 2019-07-29: 1000 ug via RECTAL

## 2019-07-29 MED ORDER — METOCLOPRAMIDE HCL 5 MG/ML IJ SOLN
INTRAMUSCULAR | Status: DC | PRN
  Administered 2019-07-29: 10 mg via INTRAVENOUS

## 2019-07-29 MED ORDER — FENTANYL-BUPIVACAINE-NACL 0.5-0.125-0.9 MG/250ML-% EP SOLN
12.0000 mL/h | EPIDURAL | Status: DC | PRN
  Filled 2019-07-29: qty 250

## 2019-07-29 MED ORDER — PRENATAL MULTIVITAMIN CH
1.0000 | ORAL_TABLET | Freq: Every day | ORAL | Status: DC
  Administered 2019-07-30 – 2019-08-01 (×3): 1 via ORAL
  Filled 2019-07-29 (×3): qty 1

## 2019-07-29 MED ORDER — LACTATED RINGERS IV SOLN: INTRAVENOUS | Status: DC

## 2019-07-29 MED ORDER — KETOROLAC TROMETHAMINE 30 MG/ML IJ SOLN
30.0000 mg | Freq: Four times a day (QID) | INTRAMUSCULAR | Status: AC
  Administered 2019-07-30 (×3): 30 mg via INTRAVENOUS
  Filled 2019-07-29 (×3): qty 1

## 2019-07-29 MED ORDER — LACTATED RINGERS IV SOLN: 500.0000 mL | Freq: Once | INTRAVENOUS | Status: DC

## 2019-07-29 MED ORDER — SIMETHICONE 80 MG PO CHEW: 80.0000 mg | CHEWABLE_TABLET | ORAL | Status: DC | PRN

## 2019-07-29 MED ORDER — CEFAZOLIN SODIUM-DEXTROSE 2-4 GM/100ML-% IV SOLN: 2.0000 g | INTRAVENOUS | Status: DC

## 2019-07-29 MED ORDER — KETOROLAC TROMETHAMINE 30 MG/ML IJ SOLN
INTRAMUSCULAR | Status: AC
  Filled 2019-07-29: qty 1

## 2019-07-29 MED ORDER — STERILE WATER FOR IRRIGATION IR SOLN
Status: DC | PRN
  Administered 2019-07-29: 1000 mL

## 2019-07-29 MED ORDER — COCONUT OIL OIL: 1.0000 "application " | TOPICAL_OIL | Status: DC | PRN

## 2019-07-29 MED ORDER — ONDANSETRON HCL 4 MG/2ML IJ SOLN
INTRAMUSCULAR | Status: DC | PRN
  Administered 2019-07-29: 4 mg via INTRAVENOUS

## 2019-07-29 MED ORDER — TETANUS-DIPHTH-ACELL PERTUSSIS 5-2.5-18.5 LF-MCG/0.5 IM SUSP: 0.5000 mL | Freq: Once | INTRAMUSCULAR | Status: DC

## 2019-07-29 MED ORDER — CEFAZOLIN SODIUM-DEXTROSE 2-3 GM-%(50ML) IV SOLR
INTRAVENOUS | Status: DC | PRN
  Administered 2019-07-29: 2 g via INTRAVENOUS

## 2019-07-29 MED ORDER — SODIUM CHLORIDE 0.9 % IV SOLN
INTRAVENOUS | Status: AC
  Filled 2019-07-29: qty 500

## 2019-07-29 MED ORDER — ACETAMINOPHEN 500 MG PO TABS
1000.0000 mg | ORAL_TABLET | Freq: Four times a day (QID) | ORAL | Status: AC
  Filled 2019-07-29 (×2): qty 2

## 2019-07-29 MED ORDER — MORPHINE SULFATE (PF) 0.5 MG/ML IJ SOLN
INTRAMUSCULAR | Status: AC
  Filled 2019-07-29: qty 10

## 2019-07-29 MED ORDER — OXYTOCIN 40 UNITS IN NORMAL SALINE INFUSION - SIMPLE MED
INTRAVENOUS | Status: AC
  Filled 2019-07-29: qty 1000

## 2019-07-29 MED ORDER — SIMETHICONE 80 MG PO CHEW
80.0000 mg | CHEWABLE_TABLET | ORAL | Status: DC
  Administered 2019-07-30 – 2019-07-31 (×2): 80 mg via ORAL
  Filled 2019-07-29 (×2): qty 1

## 2019-07-29 MED ORDER — MENTHOL 3 MG MT LOZG: 1.0000 | LOZENGE | OROMUCOSAL | Status: DC | PRN

## 2019-07-29 MED ORDER — DIBUCAINE (PERIANAL) 1 % EX OINT: 1.0000 "application " | TOPICAL_OINTMENT | CUTANEOUS | Status: DC | PRN

## 2019-07-29 MED ORDER — INSULIN ASPART 100 UNIT/ML ~~LOC~~ SOLN
2.0000 [IU] | Freq: Once | SUBCUTANEOUS | Status: AC
  Administered 2019-07-29: 2 [IU] via SUBCUTANEOUS

## 2019-07-29 MED ORDER — DIPHENHYDRAMINE HCL 25 MG PO CAPS: 25.0000 mg | ORAL_CAPSULE | ORAL | Status: DC | PRN

## 2019-07-29 MED ORDER — KETOROLAC TROMETHAMINE 30 MG/ML IJ SOLN: 30.0000 mg | Freq: Once | INTRAMUSCULAR | Status: DC

## 2019-07-29 MED ORDER — SODIUM CHLORIDE 0.9 % IR SOLN
Status: DC | PRN
  Administered 2019-07-29: 1

## 2019-07-29 MED ORDER — SIMETHICONE 80 MG PO CHEW
80.0000 mg | CHEWABLE_TABLET | Freq: Three times a day (TID) | ORAL | Status: DC
  Administered 2019-07-30 – 2019-08-01 (×5): 80 mg via ORAL
  Filled 2019-07-29 (×6): qty 1

## 2019-07-29 MED ORDER — DEXAMETHASONE SODIUM PHOSPHATE 4 MG/ML IJ SOLN
INTRAMUSCULAR | Status: AC
  Filled 2019-07-29: qty 1

## 2019-07-29 MED ORDER — KETOROLAC TROMETHAMINE 30 MG/ML IJ SOLN
30.0000 mg | Freq: Once | INTRAMUSCULAR | Status: AC
  Administered 2019-07-29: 20:00:00 30 mg via INTRAVENOUS

## 2019-07-29 SURGICAL SUPPLY — 29 items
APPLICATOR ARISTA FLEXITIP XL (MISCELLANEOUS) ×2 IMPLANT
BENZOIN TINCTURE PRP APPL 2/3 (GAUZE/BANDAGES/DRESSINGS) ×4 IMPLANT
CLAMP CORD UMBIL (MISCELLANEOUS) ×2 IMPLANT
CLOSURE STERI STRIP 1/2 X4 (GAUZE/BANDAGES/DRESSINGS) ×2 IMPLANT
CLOTH BEACON ORANGE TIMEOUT ST (SAFETY) ×2 IMPLANT
DRSG OPSITE POSTOP 4X10 (GAUZE/BANDAGES/DRESSINGS) ×2 IMPLANT
ELECT REM PT RETURN 9FT ADLT (ELECTROSURGICAL) ×2
ELECTRODE REM PT RTRN 9FT ADLT (ELECTROSURGICAL) ×1 IMPLANT
EXTRACTOR VACUUM KIWI (MISCELLANEOUS) IMPLANT
EXTRACTOR VACUUM M CUP 4 TUBE (SUCTIONS) IMPLANT
GLOVE BIO SURGEON STRL SZ7.5 (GLOVE) ×2 IMPLANT
GLOVE BIOGEL PI IND STRL 7.5 (GLOVE) ×1 IMPLANT
GLOVE BIOGEL PI INDICATOR 7.5 (GLOVE) ×1
GOWN STRL REUS W/TWL 2XL LVL3 (GOWN DISPOSABLE) ×2 IMPLANT
KIT ABG SYR 3ML LUER SLIP (SYRINGE) IMPLANT
NEEDLE HYPO 25X5/8 SAFETYGLIDE (NEEDLE) ×2 IMPLANT
NS IRRIG 1000ML POUR BTL (IV SOLUTION) ×2 IMPLANT
PACK C SECTION WH (CUSTOM PROCEDURE TRAY) ×2 IMPLANT
PAD ABD 7.5X8 STRL (GAUZE/BANDAGES/DRESSINGS) ×2 IMPLANT
PAD OB MATERNITY 4.3X12.25 (PERSONAL CARE ITEMS) ×2 IMPLANT
RTRCTR C-SECT PINK 25CM LRG (MISCELLANEOUS) ×2 IMPLANT
SPONGE GAUZE 4X4 12PLY STER LF (GAUZE/BANDAGES/DRESSINGS) ×4 IMPLANT
SUT VIC AB 0 CT1 36 (SUTURE) ×6 IMPLANT
SUT VIC AB 2-0 CT1 27 (SUTURE) ×2
SUT VIC AB 2-0 CT1 TAPERPNT 27 (SUTURE) ×2 IMPLANT
SUT VIC AB 4-0 KS 27 (SUTURE) ×2 IMPLANT
TOWEL OR 17X24 6PK STRL BLUE (TOWEL DISPOSABLE) ×2 IMPLANT
TRAY FOLEY W/BAG SLVR 14FR LF (SET/KITS/TRAYS/PACK) IMPLANT
WATER STERILE IRR 1000ML POUR (IV SOLUTION) ×2 IMPLANT

## 2019-07-29 NOTE — Discharge Summary (Signed)
Postpartum Discharge Summary     Patient Name: Melissa Solomon DOB: 03/24/1981 MRN: [id number]  Date of admission: 07/28/2019 Delivering Provider: Juanna Cao T   Date of discharge: 07/31/2019  Admitting diagnosis: Gestational diabetes [O24.419] Intrauterine pregnancy: [redacted]w[redacted]d    Secondary diagnosis:  Active Problems:   AMA (advanced maternal age) multigravida 35+   Gestational diabetes   Lab test positive for detection of COVID-19 virus   Encounter for induction of labor  Additional problems: None     Discharge diagnosis: Term Pregnancy Delivered                                                                                                Post partum procedures:None  Augmentation: AROM, Pitocin and Cytotec  Complications: None  Hospital course:  Induction of Labor With Cesarean Section  39y.o. yo G1P0 at 319w1das admitted to the hospital 07/28/2019 for induction of labor. Patient had a labor course significant for cytotec, Pitocin and AROM. The patient went for cesarean section due to Arrest of Descent, and delivered a Viable infant,07/29/2019  Membrane Rupture Time/Date: 9:53 PM ,07/29/2019    Details of operation can be found in separate operative Note.  Patient had an uncomplicated postpartum course. She is ambulating, tolerating a regular diet, passing flatus, and urinating well.  Patient is discharged home in stable condition on 07/31/19.                                   Delivery time: 6:31 PM   Magnesium Sulfate received: No BMZ received: No Rhophylac:N/A MMR:N/A Transfusion:No  Physical exam  Vitals:   07/30/19 1030 07/30/19 1700 07/30/19 2305 07/31/19 0500  BP: 112/67 105/64 109/64 127/73  Pulse:   77 63  Resp: '18  18 18  ' Temp: 98.8 F (37.1 C) 98.8 F (37.1 C) 98.1 F (36.7 C) 98.2 F (36.8 C)  TempSrc: Oral Oral Oral Oral  SpO2: 97% 100% 100% 99%  Weight:      Height:       General: alert, cooperative and no distress Lochia:  appropriate Uterine Fundus: firm Incision: Healing well with no significant drainage, No significant erythema, Dressing is clean, dry, and intact DVT Evaluation: No evidence of DVT seen on physical exam. Labs: Lab Results  Component Value Date   WBC 14.0 (H) 07/30/2019   HGB 9.8 (L) 07/30/2019   HCT 30.2 (L) 07/30/2019   MCV 88.8 07/30/2019   PLT 258 07/30/2019   CMP Latest Ref Rng & Units 07/30/2019  Glucose 70 - 99 -  Creatinine 0.44 - 1.00 mg/dL 0.63   Edinburgh Score: Edinburgh Postnatal Depression Scale Screening Tool 07/30/2019  I have been able to laugh and see the funny side of things. 0  I have looked forward with enjoyment to things. 1  I have blamed myself unnecessarily when things went wrong. 0  I have been anxious or worried for no good reason. 1  I have felt scared or panicky for no good reason. 0  Things have  been getting on top of me. 1  I have been so unhappy that I have had difficulty sleeping. 0  I have felt sad or miserable. 1  I have been so unhappy that I have been crying. 0  The thought of harming myself has occurred to me. 0  Edinburgh Postnatal Depression Scale Total 4    Discharge instruction: per After Visit Summary and "Baby and Me Booklet".  After visit meds:  Allergies as of 07/31/2019   No Known Allergies     Medication List    STOP taking these medications   Accu-Chek Nano SmartView w/Device Kit   Accu-Chek Softclix Lancets lancets   glucose blood test strip   insulin NPH Human 100 UNIT/ML injection Commonly known as: NOVOLIN N     TAKE these medications   acetaminophen 500 MG tablet Commonly known as: TYLENOL Take 1 tablet (500 mg total) by mouth every 6 (six) hours as needed.   ibuprofen 800 MG tablet Commonly known as: ADVIL Take 1 tablet (800 mg total) by mouth every 6 (six) hours.   oxyCODONE 5 MG immediate release tablet Commonly known as: Oxy IR/ROXICODONE Take 1-2 tablets (5-10 mg total) by mouth every 4 (four) hours  as needed for moderate pain.   senna-docusate 8.6-50 MG tablet Commonly known as: Senokot-S Take 2 tablets by mouth daily. Start taking on: August 01, 2019   Vitafol Ultra 29-0.6-0.4-200 MG Caps Take 1 tablet by mouth daily.       Diet: routine diet  Activity: Advance as tolerated. Pelvic rest for 6 weeks.   Outpatient follow up: 4-6 weeks Follow up Appt:No future appointments. Follow up Visit: Please schedule this patient for Postpartum visit in: 4-6 weeks with the following provider: Any provider In-person For C/S patients schedule nurse incision check in weeks 2 weeks: yes High risk pregnancy complicated by: GDM- on insulin, AMA, COVID+ (Asymptomatic) Delivery mode:  CS Anticipated Birth Control:  other/unsure PP Procedures needed: Incision check, 2 hour GTT Schedule Integrated BH visit: no  Newborn Data: Live born female  Birth Weight: 3385g APGAR: 45, 10  Newborn Delivery   Birth date/time: 07/29/2019 18:31:00 Delivery type: C-Section, Low Transverse Trial of labor: Yes C-section categorization: Primary      Baby Feeding: Bottle and Breast Disposition:home with mother   07/31/2019 Chauncey Mann, MD

## 2019-07-29 NOTE — Anesthesia Preprocedure Evaluation (Addendum)
Anesthesia Evaluation  Patient identified by MRN, date of birth, ID band Patient awake    Reviewed: Allergy & Precautions, H&P , NPO status , Patient's Chart, lab work & pertinent test results  History of Anesthesia Complications Negative for: history of anesthetic complications  Airway Mallampati: II  TM Distance: >3 FB Neck ROM: full    Dental no notable dental hx. (+) Teeth Intact   Pulmonary  COVID+   Pulmonary exam normal breath sounds clear to auscultation       Cardiovascular negative cardio ROS Normal cardiovascular exam Rhythm:regular Rate:Normal     Neuro/Psych negative neurological ROS  negative psych ROS   GI/Hepatic negative GI ROS, Neg liver ROS,   Endo/Other  negative endocrine ROSdiabetes, Gestational, Insulin Dependent  Renal/GU negative Renal ROS     Musculoskeletal negative musculoskeletal ROS (+)   Abdominal (+) + obese,   Peds  Hematology negative hematology ROS (+)   Anesthesia Other Findings Covid-19+  Reproductive/Obstetrics (+) Pregnancy                            Anesthesia Physical Anesthesia Plan  ASA: III  Anesthesia Plan: Epidural   Post-op Pain Management:    Induction:   PONV Risk Score and Plan: 3 and Ondansetron, Dexamethasone and Treatment may vary due to age or medical condition  Airway Management Planned: Natural Airway  Additional Equipment:   Intra-op Plan:   Post-operative Plan:   Informed Consent: I have reviewed the patients History and Physical, chart, labs and discussed the procedure including the risks, benefits and alternatives for the proposed anesthesia with the patient or authorized representative who has indicated his/her understanding and acceptance.       Plan Discussed with: CRNA  Anesthesia Plan Comments: (Epidural to be used for C/S (failure to progress). Stephannie Peters, MD)       Anesthesia Quick Evaluation

## 2019-07-29 NOTE — Discharge Instructions (Signed)
Cuidados aps um parto por cesariana Postpartum Care After Cesarean Delivery Este folheto fornece informaes sobre como cuidar de si, desde o momento do parto at as 6-12 semanas posteriores (perodo ps-parto). Seu mdico tambm poder fornecer instrues mais especficas. Caso tenha problemas ou perguntas, entre em contato com o seu mdico. Siga essas instrues em casa: Medicamentos  Tome medicamentos vendidos com ou sem receita mdica somente de acordo com as indicaes do seu mdico.  Caso tenha recebido uma prescrio de antibitico, tome-o somente como determinado pelo seu mdico. No pare de tomar o antibitico mesmo se comear a se sentir melhor.  Pergunte ao seu mdico se o medicamento prescrito para voc: ? Exige que voc evite dirigir ou comandar mquinas pesadas. ? Pode causar priso de ventre. Voc pode precisar adotar certas condutas para prevenir ou tratar a priso de ventre, como:  Beba lquidos em quantidade suficiente para manter a urina na cor amarelo-plida.  Tome medicamentos vendidos com ou sem receita mdica.  Coma alimentos ricos em fibra, como frutas e legumes frescos, gros integrais e feijes.  Limite alimentos ricos em gordura e acares refinados, como frituras e doces. Atividades  Retorne gradualmente s suas atividades normais de acordo com as orientaes do seu mdico.  Evite atividades que exijam muito esforo e energia (sejam extenuantes) at receber aprovao do seu mdico. Geralmente  seguro caminhar a uma velocidade de lenta a moderada. Pergunte ao seu mdico quais atividades so seguras para voc. ? No levante nada mais pesado que seu beb ou mais de 10 libras (4,5 kg) conforme orientado pelo seu mdico. ? No faa faxina, no suba escadas nem dirija durante o tempo determinado pelo seu mdico.  Se possvel, pea para algum ajud-la em casa at que voc possa fazer suas atividades usuais sozinha.  Repouse o mximo possvel. Tente descansar ou  tirar sonecas enquanto seu beb estiver dormindo. Sangramento vaginal   normal ter sangramento vaginal (lquios) aps o parto. Use um absorvente para conter o sangramento e secreo vaginal. ? Durante a primeira semana aps o parto, a quantidade e a aparncia dos lquios costumam ser semelhantes a um perodo menstrual. ? Nas semanas seguintes, ele diminuir gradualmente para uma secreo amarelada e seca. ? Para a maioria das mulheres, os lquios cessam completamente at 4-6 semanas aps o parto. O sangramento vaginal pode variar de mulher para mulher.  Troque seus absorventes com frequncia. Fique atento a qualquer alterao no seu fluxo, como: ? Um aumento repentino no volume. ? Mudana na cor. ? Grandes cogulos sanguneos.  Se expelir um grande cogulo sanguneo, guarde-o para mostrar ao seu mdico. No d descarga se houver cogulos de sangue no vaso antes de receber as orientaes do seu mdico.  No use absorventes internos nem faa duchas vaginais at o seu mdico dizer que  seguro faz-lo.  Caso no esteja amamentando, sua menstruao dever retornar 6-8 semanas aps o parto. Caso esteja amamentando, sua menstruao poder retornar a qualquer momento entre 8 semanas aps o parto e o momento em que voc parar de amamentar. Cuidados com a rea perineal   Se sua cesrea (cesariana) no foi planejada, e voc foi orientada a entrar em trabalho de parto e fazer fora antes do parto, voc pode sentir dor, inchao e desconforto no tecido entre a abertura vaginal e o nus (perneo). Voc pode ter sofrido uma inciso no tecido (episiotomia) ou o tecido pode ter se rasgado durante o parto. Siga essas instrues de acordo com as orientaes do seu mdico: ? Mantenha seu   perneo limpo e seco de acordo com as orientaes do seu mdico. Use absorventes com medicamentos e sprays e cremes para aliviar a dor conforme orientado. ? Caso tenha sofrido episiotomia ou lacerao vaginal, verifique a rea  todos os dias em busca de sinais de infeco. Verifique a ocorrncia de:  Vermelhido, inchao ou dor.  Lquido ou sangue.  Calor.  Pus ou mau cheiro. ? Voc pode receber uma garrafa de esguicho para usar em vez de passar papel higinico para limpar a rea do perneo depois de ir ao banheiro. Conforme comear a cicatrizar, voc pode usar o esguicho antes de se limpar. Lembre-se de limpar suavemente. ? Para aliviar a dor causada por uma episiotomia, um lacerao vaginal ou hemorroidas, tente tomar um banho de assento morno 2-3 vezes ao dia. Um banho de assento  um banho morno que voc toma enquanto est sentada. A gua deve chegar somente at o seu quadril e cobrir suas ndegas. Cuidados com os seios  Nos primeiros dias aps o parto, suas mamas podem ficar pesadas, cheias e desconfortveis (ingurgitao mamria). Tambm pode acontecer de vazar leite de suas mamas. Seu mdico pode sugerir formas de aliviar o desconforto na mama. A ingurgitao mamria deve passar em poucos dias.  Caso esteja amamentando: ? Use um suti que oferea apoio aos seus seios e seja do tamanho adequado. ? Mantenha os mamilos limpos e secos. Aplique cremes e pomadas conforme as orientado pelo seu mdico. ? Voc pode precisar usar absorventes de mama para reter o leite que vazar. ? Voc pode ter contraes uterinas toda vez que amamentar ao longo vrias semanas aps o parto. As contraes uterinas ajudam seu tero a retornar ao tamanho normal. ? Se voc tiver algum problema com a amamentao, converse com seu mdico ou com um consultor de lactao.  Caso no esteja amamentando: ? Evite tocar suas mamas, pois isso pode aumentar a produo mais leite. ? Use um suti bem ajustado e faa compressas frias para ajudar no inchao. ? No extraia leite (bombear). Isso faz voc produzir mais leite. Intimidade e sexualidade  Pergunte ao seu mdico sobre quando voc poder ter relaes sexuais. Isso poder depender: ? Do risco  de infeces. ? Taxa de cicatrizao. ? Do conforto e do desejo de realizar atividade sexual.  Voc pode engravidar aps o parto mesmo se voc no tiver menstruado. Se desejar, converse com seu mdico sobre mtodos de planejamento familiar ou controle de natalidade (contracepo). Estilo de vida  No use nenhum produto que contenha nicotina ou tabaco, como cigarros tradicionais, cigarros eletrnicos e fumo de mascar. Caso precise de ajuda para parar de fumar, fale com seu mdico.  No consuma lcool, especialmente se estiver amamentando. Alimentos e bebidas   Beba lquidos em quantidade suficiente para manter a urina na cor amarelo-plida.  Consuma alimentos com elevado teor de fibras todos os dias. Eles podero ajudar a prevenir ou aliviar a constipao. Alimentos ricos em fibras incluem: ? Cereais e pes integrais. ? Arroz integral. ? Feijes. ? Frutas e legumes frescos.  Tome suas vitaminas pr-natal at seu check-up ps-parto ou at seu mdico lhe dizer para parar. Instrues gerais  Comparea a todas as consultas de acompanhamento suas e do beb de acordo com as orientaes do seu mdico. A maioria das mulheres passa com seu mdico para um check-up ps-parto nas primeiras 3-6 semanas aps o parto. Entre em contato com um mdico se voc:  Sentir-se incapaz de lidar com as mudanas que um novo beb traz  sua vida,   e esse sentimento no passar.  Sentir-se incomumente triste ou preocupada.  Estiver com as mamas doloridas, duras ou avermelhadas.  Tiver febre.  Tiver dificuldade de segurar a urina ou de evitar que a urina vaze.  Tiver pouco ou nenhum interesse nas atividades que voc gosta.  No tiver amamentado nenhuma vez e no menstruar por 12 semanas aps o parto.  Tiver parado de amamentar e no menstruar por 12 semanas aps parar de amamentar.  Tiver dvidas sobre como cuidar de voc mesma ou do beb.  Expelir um cogulo sanguneo pela vagina. Busque ajuda  imediatamente se voc:  Tiver dor no peito.  Tiver dificuldade para respirar.  Tiver dor repentina e intensa nas pernas.  Sentir dor ou clica intensas no abdome.  Sangrar muito pela vagina a ponto de ter que usar mais de um absorvente por hora. O sangramento no pode ser mais intenso do que a menstruao mais intensa que voc costuma ter.  Tiver dor de cabea intensa.  Desmaiar.  Ficar com a viso embaada ou com manchas na viso.  Tiver um corrimento vaginal com cheiro ruim.  Tiver pensamentos em machucar a si mesma ou ao beb. Se sentir vontade de ferir a si mesmo ou a terceiros ou pensar em tirar a prpria vida, procure ajuda imediatamente. Voc pode ir para o pronto-socorro mais prximo ou ligar para:  O nmero de emergncia local (911, nos [place]).  Um servio telefnico de preveno do suicdio, como o National Suicide Prevention Lifeline (Linha da Vida de Preveno ao Suicdio Nacional), no nmero 1-800-273-8255. Ele funciona 24 horas por dia. Resumo  O perodo de tempo aps o nascimento do recm-nascido at 6-12 semanas depois do parto  chamado de perodo ps-parto.  Retorne gradualmente s suas atividades normais de acordo com as orientaes do seu mdico.  Comparea a todas as consultas de acompanhamento suas e do beb de acordo com as orientaes do seu mdico. Estas informaes no se destinam a substituir as recomendaes de seu mdico. No deixe de discutir quaisquer dvidas com seu mdico. Document Revised: 12/15/2017 Document Reviewed: 12/15/2017 Elsevier Patient Education  2020 Elsevier Inc.  

## 2019-07-29 NOTE — Progress Notes (Addendum)
Has been on pitocin since 1409 yesterday. Contractions difficult to pick up on TOCO. Has been 8 cm >2 hours.  FHT: 125 bpm, moderate variability, accels, occasional variables- CAT II (reassuring for moderate variability and accels)  Plan: Stop pitocin for pit break Plan to restart pitocin in ~2 hours, will place IUPC with cervical exam  CBGs: most recent 96, 107. Will continue to follow  Marlowe Alt, DO OB Fellow, [facility] 07/29/2019 9:15 AM

## 2019-07-29 NOTE — Progress Notes (Signed)
LABOR PROGRESS NOTE  Melissa Solomon is a 39 y.o. G1P0 at [redacted]w[redacted]d  admitted for IOL for GDMA2.  Subjective: Comfortable w epidural  Objective: BP 118/65   Pulse 75   Temp 97.9 F (36.6 C) (Oral)   Resp 18   Ht 4\' 10"  (1.473 m)   Wt 72.5 kg   LMP 10/28/2018 (Exact Date)   BMI 33.40 kg/m  or  Vitals:   07/29/19 0401 07/29/19 0431 07/29/19 0530 07/29/19 0601  BP: 115/72 120/62 111/62 118/65  Pulse: 81 83 72 75  Resp: 18 18 18 18   Temp:    97.9 F (36.6 C)  TempSrc:    Oral  Weight:      Height:         Dilation: 8 Effacement (%): 100 Cervical Position: Middle Station: -1, 0 Presentation: Vertex Exam by:: , RNC FHT: baseline rate 130, moderate varibility, +acel, variable and early decel Toco: q3-4 min  Labs: Lab Results  Component Value Date   WBC 8.4 07/28/2019   HGB 11.1 (L) 07/28/2019   HCT 34.3 (L) 07/28/2019   MCV 88.4 07/28/2019   PLT 320 07/28/2019    Patient Active Problem List   Diagnosis Date Noted  . Gestational diabetes 07/28/2019  . Lab test positive for detection of COVID-19 virus 07/28/2019  . AMA (advanced maternal age) multigravida 35+ 05/05/2019  . Gestational diabetes mellitus (GDM) 04/11/2019  . Supervision of high risk pregnancy, antepartum 04/11/2019    Assessment / Plan: 39 y.o. G1P0 at [redacted]w[redacted]d here for IOL for GDMA2.  Labor: Progressing, cont pitocin augmentation  Fetal Wellbeing:  Cat II for variables but overall reassuring with moderate variability and accels Pain Control:  Would like epidural GBS: negative Anticipated MOD:  SVD  GDMA2: on QHS NPH prior to admission, CBG q4h latent labor and q2h active labor, most recent value uncontrolled (103>106>96>107>127>154) and given 2u aspart, consider endotool if still uncontrolled at next check  COVID +: asymptomatic positive on screening admission, cont precautions  24, MD/MPH OB Fellow  07/29/2019, 6:47 AM

## 2019-07-29 NOTE — Progress Notes (Signed)
LABOR PROGRESS NOTE  Ariyana Lakysha Kossman is a 39 y.o. G1P0 at [redacted]w[redacted]d  admitted for IOL for GDMA2.  Subjective: Strip review  Objective: BP (!) 122/52   Pulse 76   Temp 98.9 F (37.2 C) (Oral)   Resp 18   Ht 4\' 10"  (1.473 m)   Wt 72.5 kg   LMP 10/28/2018 (Exact Date)   BMI 33.40 kg/m  or  Vitals:   07/28/19 1745 07/28/19 2024 07/28/19 2108 07/28/19 2355  BP: 131/72 120/66 134/72 (!) 122/52  Pulse: 76 79 83 76  Resp: 18 18 18 18   Temp:      TempSrc:      Weight:      Height:         Dilation: 6.5 Effacement (%): 100 Cervical Position: Middle Station: -1, 0 Presentation: Vertex Exam by:: , RNC FHT: baseline rate 125, moderate varibility, +acel, variable decel Toco: q2-3 min  Labs: Lab Results  Component Value Date   WBC 8.4 07/28/2019   HGB 11.1 (L) 07/28/2019   HCT 34.3 (L) 07/28/2019   MCV 88.4 07/28/2019   PLT 320 07/28/2019    Patient Active Problem List   Diagnosis Date Noted  . Gestational diabetes 07/28/2019  . Lab test positive for detection of COVID-19 virus 07/28/2019  . AMA (advanced maternal age) multigravida 35+ 05/05/2019  . Gestational diabetes mellitus (GDM) 04/11/2019  . Supervision of high risk pregnancy, antepartum 04/11/2019    Assessment / Plan: 39 y.o. G1P0 at [redacted]w[redacted]d here for IOL for GDMA2.  Labor: Progressed from 4.5>6.5cm with some fetal descent as well, progressing normally, cont pitocin augmentation  Fetal Wellbeing:  Cat II for variables but overall reassuring with moderate variability and accels, consider IUPC/amnioinfusion if variables are persistent Pain Control:  Would like epidural GBS: negative Anticipated MOD:  SVD  GDMA2: on QHS NPH prior to admission, CBG q4h latent labor and q2h active labor, have been well controlled overall though last value somewhat elevated, may need endotool if next check persistently elevated (103>106>96>107>127)  24, MD/MPH OB Fellow  07/29/2019, 2:00 AM

## 2019-07-29 NOTE — Op Note (Signed)
Operative Note   SURGERY DATE: 07/29/2019  PRE-OP DIAGNOSIS:  *Pregnancy at 39 weeks *Arrest of Descent  POST-OP DIAGNOSIS:  *Pregnancy at 39 weeks *Arrest of Descent   PROCEDURE: primary low transverse cesarean section via pfannenstiel skin incision with double layer uterine closure  SURGEON: Surgeon(s) and Role:    * Malachy Chamber, MD - Primary    * Junaid Wurzer, Margarette Asal, DO - Fellow  ASSISTANT: Laural Benes, MS-3 - Student  ANESTHESIA: epidural  ESTIMATED BLOOD LOSS: 375 mL  DRAINS: 300 mL UOP via indwelling foley- concentrated  TOTAL IV FLUIDS: 2000 mL crystalloid  VTE PROPHYLAXIS: SCDs to bilateral lower extremities  ANTIBIOTICS: Two grams of Cefazolin and 500 mg Azithromycin were given, within 1 hour of skin incision  SPECIMENS: Placenta to pathology  COMPLICATIONS: Uterine atony- requiring methergine; 1000 mcg cytotec given in PACU  INDICATIONS: Patient has been 10/100/0,-1 station for 3.5 hours. No descent of the fetal head with 2 hours labor down, 1.5 hours of pushing. Upon SVE, fetal head still 0,-1 station, OT presentation. Thick meconium-stained fluid noted after SVE.  FINDINGS: No intra-abdominal adhesions were noted. Grossly normal uterus, tubes and ovaries. Meconium-stained amniotic fluid, cephalic ROT female infant with one nuchal cord, weight per medical record, APGARs 9/10, intact placenta.  PROCEDURE IN DETAIL: The patient was taken to the operating room where her epidural was dosed up to surgical levels. She was then prepped and draped in the normal fashion in the dorsal supine position with a leftward tilt.  After a time out was performed, a pfannensteil skin incision was made with the scalpel and carried through to the underlying layer of fascia. The fascia was then incised at the midline and this incision was extended laterally bluntly. Attention was turned to the superior aspect of the fascial incision which was grasped with the kocher clamps x 2,  tented up and the rectus muscles were dissected off bluntly. In a similar fashion the inferior aspect of the fascial incision was grasped with the kocher clamps, tented up and the rectus muscles dissected off bluntly. The rectus muscles were then separated in the midline and the peritoneum was entered bluntly. The bladder blade was inserted and the vesicouterine peritoneum was identified.  A low transverse hysterotomy was made with the scalpel until the endometrial cavity was breached and the amniotic sac ruptured, yielding meconium-stained amniotic fluid. This incision was extended bluntly and the infant's head, shoulders and body were delivered atraumatically.The cord was clamped x 2 and cut, and the infant was handed to the awaiting pediatricians, after delayed cord clamping was done.  The placenta was then gradually expressed from the uterus and then the uterus was cleared of all clots and debris. The hysterotomy was repaired with a running suture of 0 Vicryl. A second imbricating layer of 0 Vicryl suture was then placed. Electrocautery and Arista were used to achieve excellent hemostasis.  The hysterotomy and all operative sites were reinspected and excellent hemostasis was noted.  The peritoneum was closed with a running stitch of 2-0 Vicryl. The fascia was reapproximated with 0 Vicryl in a simple running fashion bilaterally. The subcutaneous layer was then reapproximated with a running suture of 2-0 Vicryl, and the skin was then closed with 4-0 Vicryl, in a subcuticular fashion.  The patient  tolerated the procedure well. Sponge, lap, needle, and instrument counts were correct x 2. The patient was transferred to the recovery room awake, alert and breathing independently in stable condition.  Apphia Cropley L Alisandra Son, DO OB  Fellow [facility] [employer] manager)

## 2019-07-29 NOTE — Transfer of Care (Signed)
Immediate Anesthesia Transfer of Care Note  Patient: Melissa Solomon  Procedure(s) Performed: CESAREAN SECTION  Patient Location: PACU  Anesthesia Type:Epidural  Level of Consciousness: awake, alert  and oriented  Airway & Oxygen Therapy: Patient Spontanous Breathing  Post-op Assessment: Report given to RN and Post -op Vital signs reviewed and stable  Post vital signs: Reviewed and stable  Last Vitals:  Vitals Value Taken Time  BP 115/61 07/29/19 1933  Temp 36.8 C 07/29/19 1936  Pulse 83 07/29/19 1936  Resp 17 07/29/19 1936  SpO2 98 % 07/29/19 1936  Vitals shown include unvalidated device data.  Last Pain:  Vitals:   07/29/19 1936  TempSrc: Oral  PainSc: 0-No pain         Complications: No apparent anesthesia complications

## 2019-07-29 NOTE — Progress Notes (Signed)
Patient has been 10/100/0,-1 station for 3.5 hours. No descent of the fetal head with 2 hours labor down, 1.5 hours of pushing. Upon SVE, fetal head still 0,-1 station, OT presentation. Thick meconium-stained fluid noted after SVE.  Due to arrest of descent, with fetal malpresentation, primary Cesarean section was recommended after discussion with Dr. Mayford Knife.  The risks of cesarean section were discussed with the patient including but were not limited to: bleeding which may require transfusion or reoperation; infection which may require antibiotics; injury to bowel, bladder, ureters or other surrounding organs; injury to the fetus; need for additional procedures including hysterectomy in the event of a life-threatening hemorrhage; placental abnormalities wth subsequent pregnancies, incisional problems, thromboembolic phenomenon and other postoperative/anesthesia complications. The patient concurred with the proposed plan, giving informed written consent for the procedures.  Patient has been NPO since last night she will remain NPO for procedure. Anesthesia and OR aware.  Preoperative prophylactic antibiotics and SCDs ordered on call to the OR.  To OR when ready.  Marlowe Alt, DO OB Fellow, Faculty [facility] 07/29/2019 5:49 PM

## 2019-07-29 NOTE — Anesthesia Procedure Notes (Signed)
Epidural Patient location during procedure: OB Start time: 07/29/2019 2:40 AM End time: 07/29/2019 2:44 AM  Staffing Anesthesiologist: Leilani Able, MD Performed: anesthesiologist   Preanesthetic Checklist Completed: patient identified, IV checked, site marked, risks and benefits discussed, surgical consent, monitors and equipment checked, pre-op evaluation and timeout performed  Epidural Patient position: sitting Prep: DuraPrep and site prepped and draped Patient monitoring: continuous pulse ox and blood pressure Approach: midline Location: L3-L4 Injection technique: LOR air  Needle:  Needle type: Tuohy  Needle gauge: 17 G Needle length: 9 cm and 9 Needle insertion depth: 6 cm Catheter type: closed end flexible Catheter size: 19 Gauge Catheter at skin depth: 11 cm Test dose: negative and Other  Assessment Events: blood not aspirated, injection not painful, no injection resistance, no paresthesia and negative IV test  Additional Notes Reason for block:procedure for pain

## 2019-07-29 NOTE — Progress Notes (Signed)
SVE: 9.5/100/0 FHR: 130 bpm, mod var, accels, no decels  Plan: restart pitocin Anticipate vaginal delivery  Marlowe Alt, DO OB Fellow, [facility] 07/29/2019 12:41 PM

## 2019-07-30 LAB — CBC
HCT: 30.2 % — ABNORMAL LOW (ref 36.0–46.0)
Hemoglobin: 9.8 g/dL — ABNORMAL LOW (ref 12.0–15.0)
MCH: 28.8 pg (ref 26.0–34.0)
MCHC: 32.5 g/dL (ref 30.0–36.0)
MCV: 88.8 fL (ref 80.0–100.0)
Platelets: 258 10*3/uL (ref 150–400)
RBC: 3.4 MIL/uL — ABNORMAL LOW (ref 3.87–5.11)
RDW: 15.1 % (ref 11.5–15.5)
WBC: 14 10*3/uL — ABNORMAL HIGH (ref 4.0–10.5)
nRBC: 0 % (ref 0.0–0.2)

## 2019-07-30 LAB — GLUCOSE, CAPILLARY: Glucose-Capillary: 119 mg/dL — ABNORMAL HIGH (ref 70–99)

## 2019-07-30 LAB — CREATININE, SERUM
Creatinine, Ser: 0.63 mg/dL (ref 0.44–1.00)
GFR calc Af Amer: >60 mL/min (ref >60)
GFR calc non Af Amer: >60 mL/min (ref >60)

## 2019-07-30 MED ORDER — FERROUS SULFATE 325 (65 FE) MG PO TABS
325.0000 mg | ORAL_TABLET | Freq: Every day | ORAL | Status: DC
  Administered 2019-07-31 – 2019-08-01 (×2): 325 mg via ORAL
  Filled 2019-07-30 (×2): qty 1

## 2019-07-30 NOTE — Progress Notes (Signed)
RN did patient's assessment and attempted to do orthostatic vitals and ambulate patient.  RN used the language line interpreter Jonetta Speak [phone] was used for this assessment.  Patient's blood pressure dropped when sitting on edge of bed (83/73) and patient stated she was a little dizzy.  After sitting on edge of bed for a few minutes she stated she was less dizzy and wanted to try standing up.  Upon standing patient was very dizzy and had to immediately sit back down.  Patient was concerned about being dizzy, RN explained that this can be normal after surgery and will movement.  RN reassured patient that we would try standing again in a few hours.  RN encouraged patient to drink water and eat a snack.  Patient also stated that she was a little nauseous and requested medication, zofran was given.    All of the patient's questions were answered.  Kelyn Ponciano, [place]

## 2019-07-30 NOTE — Progress Notes (Signed)
RN used Lawyer interpreter [name] [phone] for assessment this morning.  At 0400 pt had large emesis, pt states she now feels better and is not nauseous.  Patient agrees that she should eat breakfast before attempting to ambulate again this morning.  Patient had no questions this morning.  Melissa Solomon, [place]

## 2019-07-30 NOTE — Lactation Note (Signed)
This note was copied from a baby's chart. Lactation Consultation Note Baby 9 hrs old. Used Spanish interpreter Vernona Rieger  [phone]. Mom is Covid +. Isolation precautions used. Mom has a support person who speak English that helps mom in the room and w/baby.  Mom has Large everted nipples w/inverted center. Nipples are thick, especially Lt. Nipple the baby isn't able to obtain latch w/the thickness of the nipple. Baby is just suckling on the tip of the nipple. Mom stated baby has been BF but mom stated how the baby was suckling on her nipple is what she has been doing. Explained to mom that isn't obtaining deep latch therefore it will damage mom's nipples and baby will not get any milk from her breast unless baby can obtain deep latch.  Baby is also tongue thrusting.  Explained to mom until the baby is able to obtain a deep latch the baby will need to be fed the formula until her milk comes in then she can give her BM.  W/hand expression able to express glistening of colostrum. Mom is breast/formula feeding Gerber formula. WIC referral made for pump.  Mom shown how to use DEBP & how to disassemble, clean, & reassemble parts. Mom knows to pump q3h for 15-20 min.support person was shown also since mom is c/section and needs help. Mom needs #27 flange.  Mom told LC baby has been BF well. Mom demonstrated baby BF but it wasn't well.  Praised mom for all her hard work trying to get baby to feed and supplementing.  Mom had been sick throwing up minutes before LC entered room. RN called by [facility] and RN brought mom medication and cleaned room.  Mom stated she was still feeling a little nausea. Mom given amount of how much to give baby in [place] by RN.  Plan: Mom is to pump every 3 hrs. Mom is to bottle feed baby formula/EBM until baby's mouth is bigger or mom's nipples breast tissue is small enough for baby to latch.  Mom is to do STS for feedings to help her milk come in.  Mom doesn't have a bra so  unable to wear shells.   Patient Name: Melissa Solomon UDJSH'F Date: 07/30/2019 Reason for consult: Initial assessment;Primapara;Maternal endocrine disorder;Term Type of Endocrine Disorder?: Diabetes   Maternal Data Has patient been taught Hand Expression?: Yes Does the patient have breastfeeding experience prior to this delivery?: No  Feeding Feeding Type: Formula Nipple Type: Slow - flow  LATCH Score Latch: Repeated attempts needed to sustain latch, nipple held in mouth throughout feeding, stimulation needed to elicit sucking reflex.  Audible Swallowing: None  Type of Nipple: Everted at rest and after stimulation  Comfort (Breast/Nipple): Soft / non-tender  Hold (Positioning): Full assist, staff holds infant at breast  LATCH Score: 5  Interventions Interventions: Breast feeding basics reviewed;Support pillows;Assisted with latch;Position options;Skin to skin;Breast massage;Hand express;DEBP;Breast compression;Adjust position  Lactation Tools Discussed/Used Tools: Pump Breast pump type: Double-Electric Breast Pump WIC Program: Yes Pump Review: Setup, frequency, and cleaning;Milk Storage Initiated by:: Peri Jefferson RN IBCLC Date initiated:: 07/30/19   Consult Status Consult Status: Follow-up Date: 07/31/19 Follow-up type: In-patient    Charyl Dancer 07/30/2019, 4:23 AM

## 2019-07-30 NOTE — Progress Notes (Addendum)
Subjective: Postpartum Day 1: Cesarean Delivery Patient reports feeling well. Pain is well-controlled. Foley still in place and has not ambulated. Lochia appropriate.   Objective: Vital signs in last 24 hours: Temp:  [97.9 F (36.6 C)-99.9 F (37.7 C)] 99.4 F (37.4 C) (04/10 0200) Pulse Rate:  [72-123] 80 (04/09 2139) Resp:  [7-26] 18 (04/10 0200) BP: (99-133)/(49-73) 111/64 (04/09 2139) SpO2:  [93 %-100 %] 100 % (04/09 2139)  Physical Exam:  General: alert, cooperative and appears stated age Lochia: appropriate Uterine Fundus: firm Incision: pressure dressing in place DVT Evaluation: No evidence of DVT seen on physical exam.  Recent Labs    07/28/19 0805  HGB 11.1*  HCT 34.3*    Assessment/Plan: Status post Cesarean section. Doing well postoperatively.  Continue current care. Breast and bottle feeding Pending AM CBC Undecided about contraception Will order fasting AM glucose Plan for DC POD#2 or 3  Gizella [place] N Shamel Galyean 07/30/2019, 5:16 AM

## 2019-07-31 ENCOUNTER — Encounter (HOSPITAL_COMMUNITY): Payer: Self-pay | Admitting: Obstetrics and Gynecology

## 2019-07-31 MED ORDER — ACETAMINOPHEN 500 MG PO TABS: 500.0000 mg | ORAL_TABLET | Freq: Four times a day (QID) | ORAL | 0 refills | Status: AC | PRN

## 2019-07-31 MED ORDER — IBUPROFEN 800 MG PO TABS: 800.0000 mg | ORAL_TABLET | Freq: Four times a day (QID) | ORAL | 0 refills | Status: AC

## 2019-07-31 MED ORDER — SENNOSIDES-DOCUSATE SODIUM 8.6-50 MG PO TABS: 2.0000 | ORAL_TABLET | ORAL | 0 refills | Status: AC

## 2019-07-31 MED ORDER — OXYCODONE HCL 5 MG PO TABS: 5.0000 mg | ORAL_TABLET | ORAL | 0 refills | Status: AC | PRN

## 2019-08-01 ENCOUNTER — Encounter (HOSPITAL_COMMUNITY): Payer: Self-pay | Admitting: Obstetrics and Gynecology

## 2019-08-01 NOTE — Lactation Note (Signed)
This note was copied from a baby's chart. Lactation Consultation Note Attempted to see mom, mom sleeping. Asked RN to call [facility] for a feeding.  Patient Name: Melissa Solomon VUDTH'Y Date: 08/01/2019     Maternal Data    Feeding    LATCH Score                   Interventions    Lactation Tools Discussed/Used     Consult Status      Charyl Dancer 08/01/2019, 3:38 AM

## 2019-08-01 NOTE — Discharge Summary (Signed)
Postpartum Discharge Summary     Patient Name: Melissa Solomon DOB: [birth year] MRN: [id number]  Date of admission: 07/28/2019 Delivering Provider: Juanna Cao T   Date of discharge: 08/01/2019  Admitting diagnosis: Gestational diabetes [O24.419] Intrauterine pregnancy: [redacted]w[redacted]d    Secondary diagnosis:  Active Problems:   AMA (advanced maternal age) multigravida 35+   Gestational diabetes   Lab test positive for detection of COVID-19 virus   Encounter for induction of labor  Additional problems: None     Discharge diagnosis: Term Pregnancy Delivered                                                                                                Post partum procedures:None  Augmentation: AROM, Pitocin and Cytotec  Complications: None  Hospital course:  Induction of Labor With Cesarean Section  39y.o. yo G1P0 at 312w1das admitted to the hospital 07/28/2019 for induction of labor. Patient had a labor course significant for cytotec, Pitocin and AROM. The patient went for cesarean section due to Arrest of Descent, and delivered a Viable infant,07/29/2019  Membrane Rupture Time/Date: 9:53 PM ,07/29/2019    Details of operation can be found in separate operative Note.  Patient had an uncomplicated postpartum course. Asymptomatic from COVID perspective. She stayed until POD#3 as pediatricians desired f/u virtually for infant and preferred one extra day in the hospital to check bilirubin. She is ambulating, tolerating a regular diet, passing flatus, and urinating well.  Patient is discharged home in stable condition on 08/01/19.                                   Delivery time: 6:31 PM   Magnesium Sulfate received: No BMZ received: No Rhophylac:N/A MMR:N/A Transfusion:No  Physical exam  Vitals:   07/30/19 2305 07/31/19 0500 07/31/19 1337 07/31/19 2200  BP: 109/64 127/73 132/69 104/61  Pulse: 77 63 67 64  Resp: '18 18  18  ' Temp: 98.1 F (36.7 C) 98.2 F (36.8 C) 98.2 F (36.8  C) 98.5 F (36.9 C)  TempSrc: Oral Oral Oral Oral  SpO2: 100% 99%  100%  Weight:      Height:       General: alert, cooperative and no distress Lochia: appropriate Uterine Fundus: firm Incision: Healing well with no significant drainage, No significant erythema, Dressing is clean, dry, and intact DVT Evaluation: No evidence of DVT seen on physical exam. Labs: Lab Results  Component Value Date   WBC 14.0 (H) 07/30/2019   HGB 9.8 (L) 07/30/2019   HCT 30.2 (L) 07/30/2019   MCV 88.8 07/30/2019   PLT 258 07/30/2019   CMP Latest Ref Rng & Units 07/30/2019  Glucose 70 - 99 -  Creatinine 0.44 - 1.00 mg/dL 0.63   Edinburgh Score: Edinburgh Postnatal Depression Scale Screening Tool 07/30/2019  I have been able to laugh and see the funny side of things. 0  I have looked forward with enjoyment to things. 1  I have blamed myself unnecessarily when things went wrong. 0  I have been anxious or worried for no good reason. 1  I have felt scared or panicky for no good reason. 0  Things have been getting on top of me. 1  I have been so unhappy that I have had difficulty sleeping. 0  I have felt sad or miserable. 1  I have been so unhappy that I have been crying. 0  The thought of harming myself has occurred to me. 0  Edinburgh Postnatal Depression Scale Total 4    Discharge instruction: per After Visit Summary and "Baby and Me Booklet".  After visit meds:  Allergies as of 08/01/2019   No Known Allergies     Medication List    STOP taking these medications   Accu-Chek Nano SmartView w/Device Kit   Accu-Chek Softclix Lancets lancets   glucose blood test strip   insulin NPH Human 100 UNIT/ML injection Commonly known as: NOVOLIN N     TAKE these medications   acetaminophen 500 MG tablet Commonly known as: TYLENOL Take 1 tablet (500 mg total) by mouth every 6 (six) hours as needed.   ibuprofen 800 MG tablet Commonly known as: ADVIL Take 1 tablet (800 mg total) by mouth every  6 (six) hours.   oxyCODONE 5 MG immediate release tablet Commonly known as: Oxy IR/ROXICODONE Take 1-2 tablets (5-10 mg total) by mouth every 4 (four) hours as needed for moderate pain.   senna-docusate 8.6-50 MG tablet Commonly known as: Senokot-S Take 2 tablets by mouth daily.   Vitafol Ultra 29-0.6-0.4-200 MG Caps Take 1 tablet by mouth daily.       Diet: routine diet  Activity: Advance as tolerated. Pelvic rest for 6 weeks.   Outpatient follow up: 4-6 weeks Follow up Appt:No future appointments. Follow up Visit: Please schedule this patient for Postpartum visit in: 4-6 weeks with the following provider: Any provider In-person For C/S patients schedule nurse incision check in weeks 2 weeks: yes High risk pregnancy complicated by: GDM- on insulin, AMA, COVID+ (Asymptomatic) Delivery mode:  CS Anticipated Birth Control:  other/unsure PP Procedures needed: Incision check, 2 hour GTT Schedule Integrated BH visit: no  Newborn Data: Live born female  Birth Weight: 3385g APGAR: 82, 10  Newborn Delivery   Birth date/time: 07/29/2019 18:31:00 Delivery type: C-Section, Low Transverse Trial of labor: Yes C-section categorization: Primary      Baby Feeding: Bottle and Breast Disposition:home with mother   08/01/2019 Chauncey Mann, MD

## 2019-08-01 NOTE — Progress Notes (Signed)
Discharge instruction for mother and infant were done using [employer] Spanish Justice Rocher #[id number].

## 2019-08-01 NOTE — Lactation Note (Signed)
This note was copied from a baby's chart. Lactation Consultation Note Baby 60 hrs old. Used Spanish interpreter [place] [phone]. Asked mom how BF was going mom stated good. Mom is supplementing w/formula after BF. Mom has DEBP in room that she isn't using. Explained to mom that her milk should be coming in soon to monitor her breast for fullness and knots. Encouraged mom to take her hand pump home.  Mom demonstrated hand expression w/colostrum to tip of nipple. Asked mom if she could latch the baby so LC could see latch and get a latch score. Baby latched in cross cradle position. The baby took the nipple in her mouth well. Not taking in areola. Mom denied pain. Discussed engorgement, milk storage, I&O, feeding STS.  Mom didn't have any questions or concerns.  Mom has WIC.  Patient Name: Melissa Solomon [id number]'V Date: 08/01/2019 Reason for consult: Follow-up assessment;Primapara;Term Type of Endocrine Disorder?: Diabetes   Maternal Data    Feeding Feeding Type: Breast Fed  LATCH Score Latch: Grasps breast easily, tongue down, lips flanged, rhythmical sucking.  Audible Swallowing: Spontaneous and intermittent  Type of Nipple: Everted at rest and after stimulation  Comfort (Breast/Nipple): Soft / non-tender  Hold (Positioning): Assistance needed to correctly position infant at breast and maintain latch.  LATCH Score: 9  Interventions Interventions: Breast feeding basics reviewed;Support pillows;Assisted with latch;Position options;Skin to skin;Breast massage;Breast compression;Adjust position  Lactation Tools Discussed/Used Tools: Pump Breast pump type: Manual   Consult Status Consult Status: Complete Date: 08/01/19    Charyl Dancer 08/01/2019, 6:44 AM

## 2019-08-02 LAB — SURGICAL PATHOLOGY

## 2019-08-15 ENCOUNTER — Ambulatory Visit (INDEPENDENT_AMBULATORY_CARE_PROVIDER_SITE_OTHER): Payer: Medicaid Other

## 2019-08-15 ENCOUNTER — Other Ambulatory Visit: Payer: Self-pay

## 2019-08-15 VITALS — BP 118/83 | HR 76 | Wt 140.0 lb

## 2019-08-15 DIAGNOSIS — Z5189 Encounter for other specified aftercare: Secondary | ICD-10-CM

## 2019-08-15 NOTE — Progress Notes (Signed)
Pt here today for incision check s/p c-section on 07/29/19. With Spanish Interpreter Raquel M., pt reports that she is having scant vaginal bleeding and mild pain that ibuprofen is effective for.  Incision well approximated- small 4 mm right of incision is open also 2 mm incision on the left has some drainage.  Notified Candelaria Celeste, DO who applied silver nitrate to open incision on right of incision.  Stinson, DO informed pt to continue to monitor for sx's of infection especially with 2 mm part of the incision on the left.  Pt also advised that we will f/u with her at her pp visit that she has scheduled on 08/29/19.  Pt verbalized understanding.    Addison Naegeli, RN  08/15/19

## 2019-08-15 NOTE — Anesthesia Postprocedure Evaluation (Signed)
Anesthesia Post Note  Patient: Melissa Solomon  Procedure(s) Performed: CESAREAN SECTION     Patient location during evaluation: PACU Anesthesia Type: Epidural Level of consciousness: awake Pain management: pain level controlled Vital Signs Assessment: post-procedure vital signs reviewed and stable Respiratory status: spontaneous breathing Cardiovascular status: stable Postop Assessment: no headache, no backache, epidural receding, patient able to bend at knees and no apparent nausea or vomiting Anesthetic complications: no    Last Vitals:  Vitals:   08/01/19 0615 08/01/19 0830  BP: 128/77 (!) 115/59  Pulse: 60   Resp: 18 16  Temp: 37 C 36.8 C  SpO2: 99%     Last Pain:  Vitals:   08/01/19 0830  TempSrc: Oral  PainSc: 5    Pain Goal: Patients Stated Pain Goal: 3 (08/01/19 0830)                 Caren Macadam

## 2019-08-15 NOTE — Progress Notes (Signed)
Chart reviewed - agree with CMA/RN documentation.  ° °

## 2019-08-29 ENCOUNTER — Other Ambulatory Visit: Payer: Self-pay | Admitting: *Deleted

## 2019-08-29 ENCOUNTER — Other Ambulatory Visit: Payer: Self-pay

## 2019-08-29 ENCOUNTER — Ambulatory Visit (INDEPENDENT_AMBULATORY_CARE_PROVIDER_SITE_OTHER): Payer: Medicaid Other | Admitting: Certified Nurse Midwife

## 2019-08-29 ENCOUNTER — Other Ambulatory Visit: Payer: Medicaid Other

## 2019-08-29 ENCOUNTER — Encounter: Payer: Self-pay | Admitting: Certified Nurse Midwife

## 2019-08-29 DIAGNOSIS — O24429 Gestational diabetes mellitus in childbirth, unspecified control: Secondary | ICD-10-CM

## 2019-08-29 DIAGNOSIS — Z1389 Encounter for screening for other disorder: Secondary | ICD-10-CM | POA: Diagnosis not present

## 2019-08-29 DIAGNOSIS — Z30011 Encounter for initial prescription of contraceptive pills: Secondary | ICD-10-CM

## 2019-08-29 DIAGNOSIS — O24439 Gestational diabetes mellitus in the puerperium, unspecified control: Secondary | ICD-10-CM

## 2019-08-29 MED ORDER — NORETHINDRONE 0.35 MG PO TABS: 1.0000 | ORAL_TABLET | Freq: Every day | ORAL | 3 refills | Status: AC

## 2019-08-29 NOTE — Progress Notes (Signed)
    Post Partum Visit Note  Melissa Solomon is a 39 y.o. G60P1001 female who presents for a postpartum visit. She is 4 weeks postpartum following aprimary low transverse cesarean section via pfannenstiel skin incision with double layer uterine closure for arrest of descent.  I have fully reviewed the prenatal and intrapartum course. The delivery was at 39 gestational weeks.  Anesthesia: spinal. Postpartum course has been unremarkable. Baby is doing well. Baby is feeding by both breast and bottle - gerber good start. Bleeding staining only. Bowel function is normal. Bladder function is normal. Patient is not sexually active. Contraception method is oral progesterone-only contraceptive. Postpartum depression screening:negative    The following portions of the patient's history were reviewed and updated as appropriate: allergies, current medications, past medical history, past surgical history and problem list.  Review of Systems Pertinent items noted in HPI and remainder of comprehensive ROS otherwise negative.    Objective:  Blood pressure 110/71, pulse 90, weight 134 lb (60.8 kg), unknown if currently breastfeeding.  General:  alert, cooperative and no distress   Breasts:  inspection negative, no nipple discharge or bleeding, no masses or nodularity palpable  Lungs: clear to auscultation bilaterally  Heart:  regular rate and rhythm  Abdomen: soft, non-tender; bowel sounds normal; no masses,  no organomegaly   Vulva:  not evaluated  Vagina: not evaluated  Cervix:  not evaluated  Corpus: not examined  Adnexa:  not evaluated  Rectal Exam: Not performed.        Assessment/Plan:   1. Gestational diabetes mellitus, delivered - educated and discussed with patient that PP GTT will be performed today, discussed abnormal vs normal results and follow up needed with abnormal results  - Glucose tolerance, 2 hours  2. Postpartum care and examination - Normal postpartum exam. Pap smear  03/24/2019, normal with negative HPV. Pap smear not needed until 2025.   3. Encounter for initial prescription of contraceptive pills - Discussed with patient that POPs can be prescribed today, once she is out of the PP period or established breastfeeding then can switch to OCPs, patient verbalizes understanding  - norethindrone (MICRONOR) 0.35 MG tablet; Take 1 tablet (0.35 mg total) by mouth daily.  Dispense: 3 Package; Refill: 3   Plan:   Essential components of care per ACOG recommendations:  1.  Mood and well being: Patient with negative depression screening today. Reviewed local resources for support.  - Patient does not use tobacco. - hx of drug use? No   2. Infant care and feeding:  -Patient currently breastmilk feeding? Yes. Reviewed importance of draining breast regularly to support lactation. -Social determinants of health (SDOH) reviewed in EPIC. No concerns  3. Sexuality, contraception and birth spacing - Patient does not want a pregnancy in the next year.  Desired family size is 2 children.  - Reviewed forms of contraception in tiered fashion. Patient desired oral progesterone-only contraceptive today.   - Discussed birth spacing of 18 months  4. Sleep and fatigue -Encouraged family/partner/community support of 4 hrs of uninterrupted sleep to help with mood and fatigue  5. Physical Recovery  - Discussed patients delivery and complications - Patient has urinary incontinence? No  - Patient is safe to resume physical and sexual activity  6.  Health Maintenance - Last pap smear done 03/24/2019 and was normal with negative HPV. - needs mammogram next year   Sharyon Cable, CNM [facility] [employer] [employer] [facility]

## 2019-08-29 NOTE — Patient Instructions (Signed)
Prueba de tolerancia a la glucosa Glucose Tolerance Test Por qu me debo realizar esta prueba? La prueba de tolerancia a la glucosa (PTG) se realiza para Energy manager en que el cuerpo procesa el azcar (glucosa). Esta es una de las diferentes pruebas que se usan para diagnosticar la diabetes (diabetes mellitus). El mdico puede recomendarle esta prueba si usted:  Tiene antecedentes familiares de diabetes.  Tiene mucho sobrepeso (es obeso).  Tiene infecciones que se repiten (recurrentes).  Ha tenido muchas heridas que no se curaron rpidamente, en especial en las piernas y los pies.  Es mujer y tiene antecedentes de haber parido bebs muy grandes o antecedentes de prdida fetal repetida (muerte fetal).  Ha tenido niveles altos de glucosa en la orina o en la sangre: ? Durante un embarazo pasado. ? Despus de un infarto de miocardio, [place] o perodos prolongados de [employer]. Qu se analiza? Esta prueba mide la cantidad de glucosa en la sangre en diferentes momentos durante un perodo de 2horas. Esto indica qu tan bien su cuerpo puede procesar la glucosa. Qu tipo de [place] se toma?  Para esta prueba, se extraen muestras de sangre. Por lo general, para extraerlas, se introduce una aguja en un vaso sanguneo. Cmo debo prepararme para esta prueba?  Durante 3 das antes de la prueba, coma normalmente. Coma muchos alimentos con alto contenido de carbohidratos.  Siga las indicaciones del mdico acerca de lo siguiente: ? Restricciones en la comida o la bebida el da de la prueba. Se le podr pedir que no coma ni beba nada ms que agua (ayuno) desde 8 a 12 horas antes de la prueba. ? Cambiar o suspender los medicamentos que toma habitualmente. Algunos medicamentos pueden interferir en esta prueba. Informe al mdico acerca de lo siguiente:  Todos los [employer], incluidos vitaminas, hierbas, gotas oftlmicas, cremas y medicamentos de venta  libre.  Cualquier enfermedad de la sangre que tenga.  Cirugas a las que se someti.  Cualquier enfermedad que tenga.  Si est embarazada o podra estarlo. Qu ocurre durante la prueba? Primero se le medir la glucemia. Esto se denomina glucemia en ayunas, ya que usted hizo ayuno antes de la prueba. Luego beber [employer] solucin de glucosa que contiene una cierta cantidad de glucosa. Se le medir la glucosa en sangre nuevamente 1 y 2 horas despus de beber la solucin. La realizacin de esta prueba lleva 2horas. Durante ese tiempo Chiropodist donde se realiza la prueba. Durante el perodo de la prueba:  No coma ni beba nada que no sea la solucin de glucosa. Se le permitir beber agua.  No haga ejercicios.  No consuma ningn producto que contenga nicotina o tabaco, como cigarrillos y Psychologist, sport and exercise. Si necesita ayuda para dejar estos productos, consulte a su mdico. El procedimiento de prueba puede variar segn el mdico y Photographer hospital. Cmo se informan los [place]? Sus resultados se informarn como miligramos de glucosa por decilitro de sangre (mg/dl) o milimoles por litro (mmol/l). El mdico comparar sus resultados con los rangos normales que se establecieron despus de Optometrist el anlisis a un grupo grande de personas (rangos de referencia). Los rangos de referencia pueden variar entre laboratorios y hospitales. Los rangos de referencia habituales para esta prueba son los siguientes:  En ayunas: menos de 110mg /dl (6,96mmol/l).  1 hora despus de beber glucosa: menos de 180mg /dl (10,79mmol/l).  2 horas despus de beber glucosa: menos de 140mg /dl (7,9mmol/l). [place] significan los resultados? Los [employer] estn  dentro de los rangos de referencia se consideran normales, lo que significa que sus niveles de glucosa estn bien controlados. Los resultados ms [employer] rangos de referencia pueden significar que usted recientemente sufri estrs, como  a causa de una lesin o una afeccin repentina (aguda) como un infarto de miocardio o un accidente cerebrovascular, o que usted tiene:  Diabetes.  Sndrome de [place].  Tumores como feocromocitoma o glucagonoma.  Insuficiencia renal.  Pancreatitis.  Hipertiroidismo.  Una infeccin. Hable con el mdico sobre lo que [employer]. Preguntas para hacerle al mdico Consulte a su mdico o pregunte en el departamento donde se realiza la prueba acerca de lo siguiente:  Cundo estarn disponibles mis resultados?  Cmo obtendr mis resultados?  Cules son mis opciones de tratamiento?  Qu otras pruebas necesito?  Cules son los prximos pasos que debo seguir? Resumen  La prueba de tolerancia a la glucosa (PTG) se realiza para Biomedical engineer en que el cuerpo procesa el azcar (glucosa). Esta es una de las diferentes pruebas que se usan para diagnosticar la diabetes (diabetes mellitus).  Esta prueba mide la cantidad de glucosa en la sangre en diferentes momentos durante un perodo de 2horas. Esto indica qu tan bien su cuerpo puede procesar la glucosa.  Hable con el mdico sobre lo que [employer]. Esta informacin no tiene Theme park manager el consejo del mdico. Asegrese de hacerle al mdico cualquier pregunta que tenga. Document Revised: 02/03/2017 Document Reviewed: 02/03/2017 Elsevier Patient Education  2020 [employer].

## 2019-08-30 LAB — GLUCOSE TOLERANCE, 2 HOURS
Glucose, 2 hour: 110 mg/dL (ref 65–139)
Glucose, GTT - Fasting: 85 mg/dL (ref 65–99)

## 2020-07-21 IMAGING — US US MFM FETAL BPP W/O NON-STRESS
1 series · 14 of 28 positions shown · non-contrast
Comparison: none

[Series 1: us mfm fetal bpp w/o non-stress · 42 acquisitions, 14 frames shown]
[im 2/42]
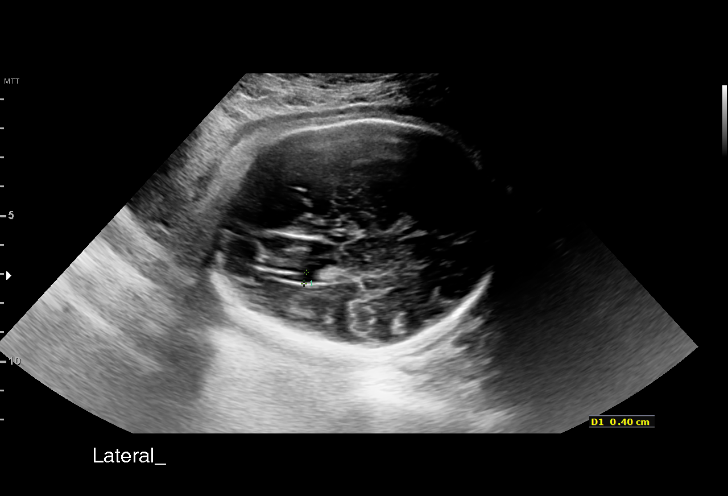
[im 5/42]
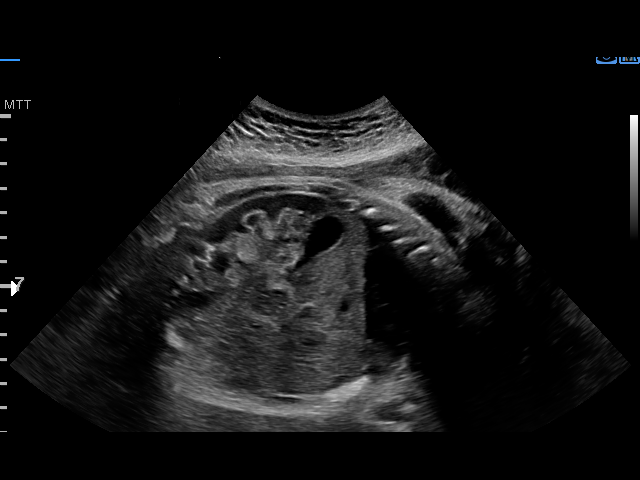
[im 8/42]
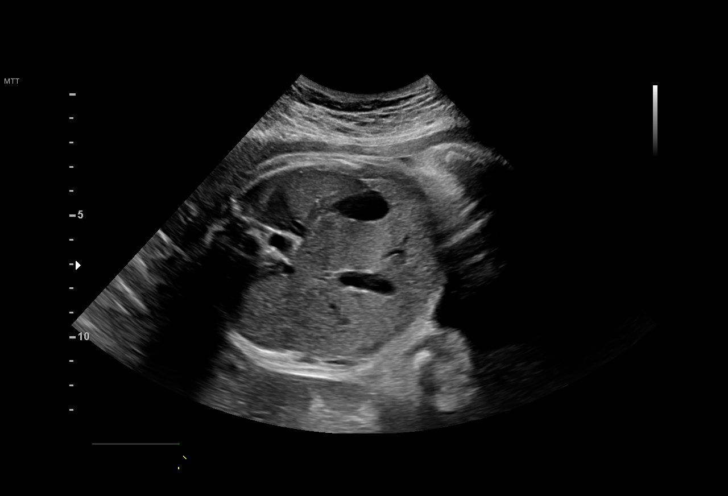
[im 11/42]
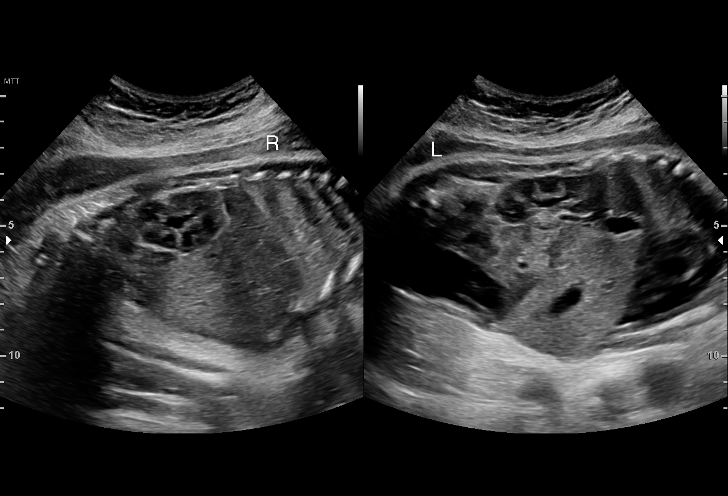
[im 14/42]
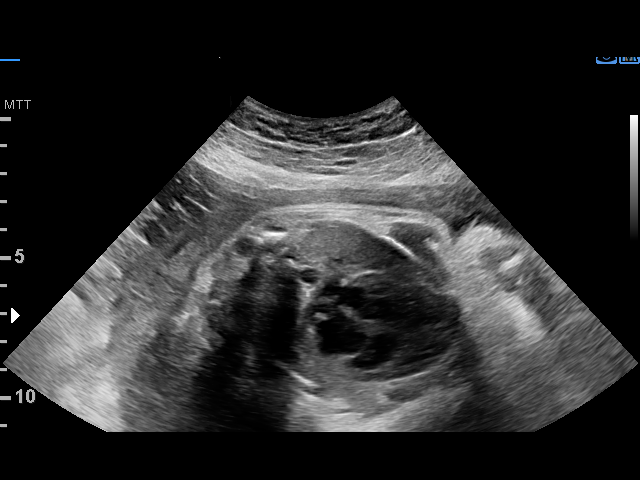
[im 17/42]
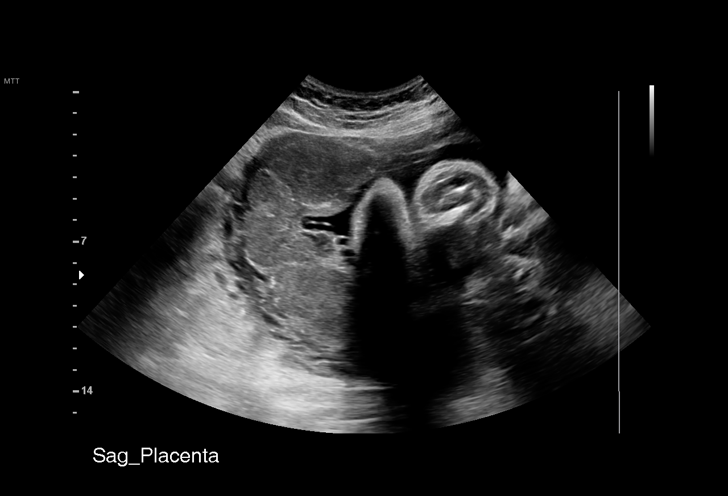
[im 20/42]
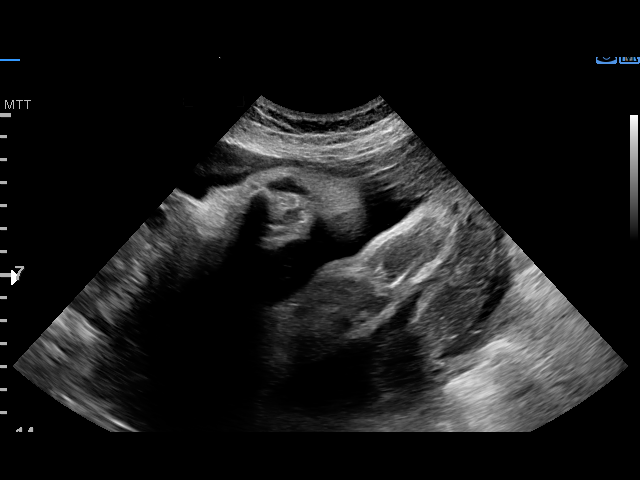
[im 23/42]
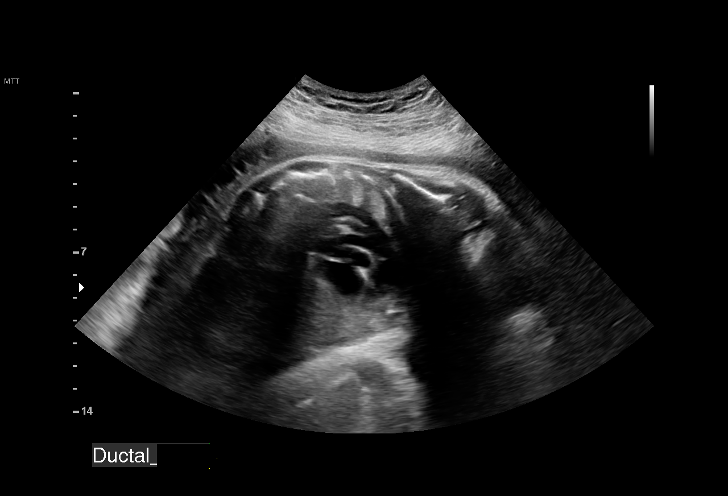
[im 26/42]
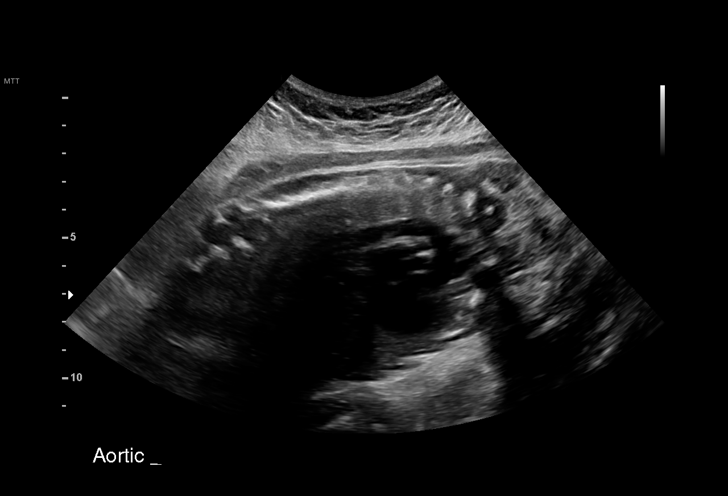
[im 29/42]
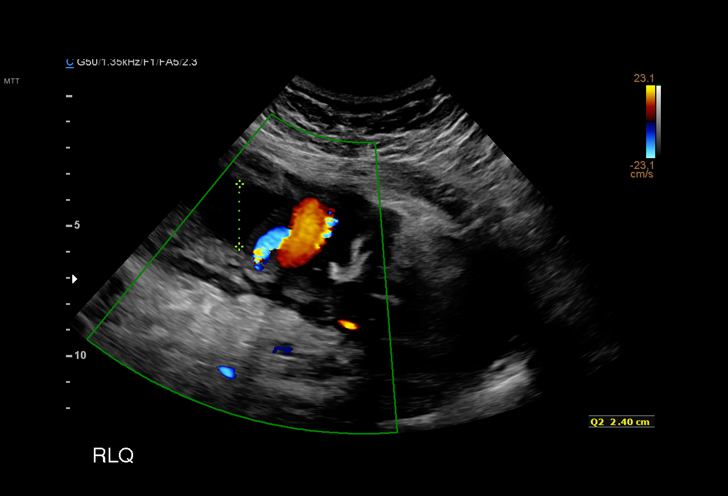
[im 32/42]
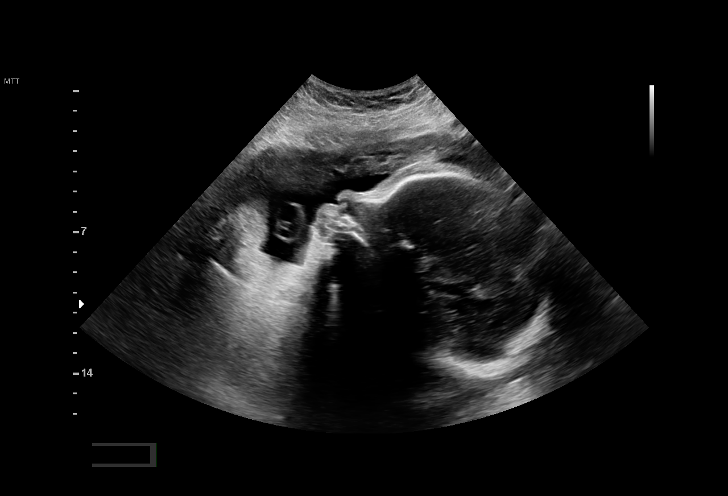
[im 35/42]
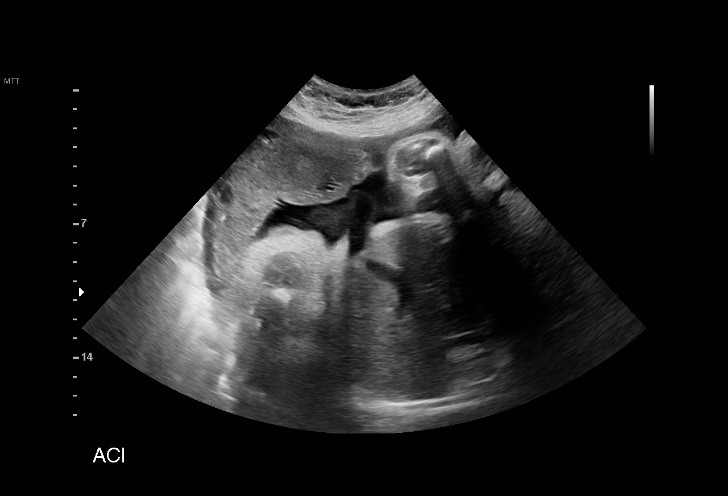
[im 38/42]
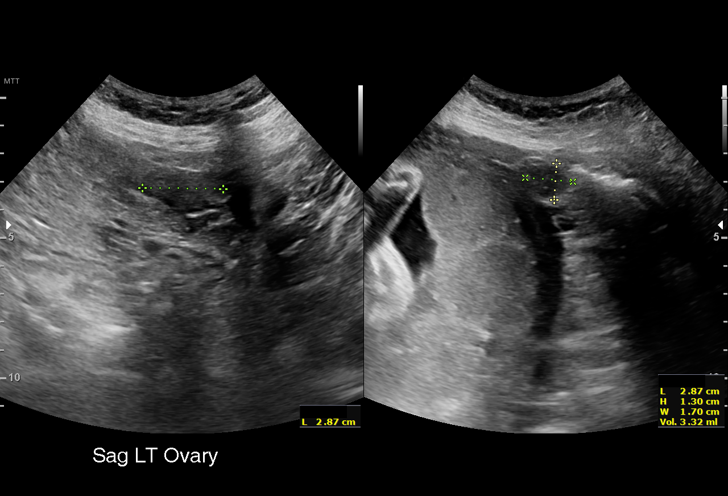
[im 42/42]
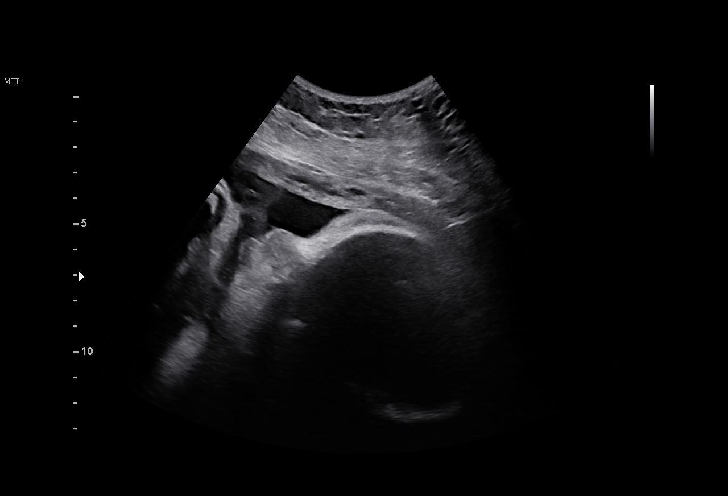

[14 of 28 positions shown; findings below may reference images not displayed]

[place]

                                                            [address]

 ----------------------------------------------------------------------

 ----------------------------------------------------------------------
Indications

  Gestational diabetes in pregnancy, insulin
  controlled (metformin and insulin)
  34 weeks gestation of pregnancy
  Advanced maternal age primigravida 35+,
  third trimester (neg QUAD)
 ----------------------------------------------------------------------
Fetal Evaluation

 Num Of Fetuses:         1
 Fetal Heart Rate(bpm):  137
 Cardiac Activity:       Observed
 Presentation:           Cephalic
 Placenta:               Posterior Left
 P. Cord Insertion:      Previously Visualized

 Amniotic Fluid
 AFI FV:      Subjectively low-normal

 AFI Sum(cm)     %Tile       Largest Pocket(cm)
 8.22            6

 RUQ(cm)       RLQ(cm)       LUQ(cm)        LLQ(cm)
 0
Biophysical Evaluation

 Amniotic F.V:   Pocket => 2 cm             F. Tone:        Observed
 F. Movement:    Observed                   Score:          [DATE]
 F. Breathing:   Observed
OB History
 Gravidity:    1         Term:   0        Prem:   0        SAB:   0
 TOP:          0       Ectopic:  0        Living: 0
Gestational Age

 LMP:           34w 0d        Date:  10/28/18                 EDD:   08/04/19
 Best:          34w 0d     Det. By:  LMP  (10/28/18)          EDD:   08/04/19
Anatomy

 Cranium:               Previously seen        Aortic Arch:            Previously seen
 Cavum:                 Previously seen        Ductal Arch:            Appears normal
 Ventricles:            Appears normal         Diaphragm:              Appears normal
 Choroid Plexus:        Previously seen        Stomach:                Appears normal, left
                                                                       sided
 Cerebellum:            Previously seen        Abdomen:                Previously seen
 Posterior Fossa:       Previously seen        Abdominal Wall:         Appears nml (cord
                                                                       insert, abd wall)
 Nuchal Fold:           Not applicable (>20    Cord Vessels:           Previously seen
                        wks GA)
 Face:                  Orbits and profile     Kidneys:                Appear normal
                        previously seen
 Lips:                  Previously seen        Bladder:                Appears normal
 Thoracic:              Previously seen        Spine:                  Limited views
                                                                       previously seen
 Heart:                 Appears normal         Upper Extremities:      Previously seen
                        (4CH, axis, and
                        situs)
 RVOT:                  Previously seen        Lower Extremities:      Previously seen
 LVOT:                  Previously seen

 Other:  Feet/heels visualized previously. Nasal bone visualized previously.
         Technically difficult due to advanced gestational age.
Cervix Uterus Adnexa

 Cervix
 Not visualized (advanced GA >38wks)

 Uterus
 No abnormality visualized.

 Left Ovary
 Within normal limits. No adnexal mass visualized.

 Right Ovary
 Within normal limits. No adnexal mass visualized.

 Cul De Sac
 No free fluid seen.

 Adnexa
 No abnormality visualized.
Comments

 This patient was seen for a biophysical profile due to
 gestational diabetes that is currently treated with insulin.  She
 denies any problems since her last exam.
 A biophysical profile performed today was [DATE].
 There was normal amniotic fluid noted on today's ultrasound
 exam.
 Another biophysical profile was scheduled in 1 week.

## 2020-08-18 IMAGING — US US MFM FETAL BPP W/O NON-STRESS
1 series · 14 of 28 positions shown · non-contrast
Comparison: none

[Series 1: us mfm fetal bpp w/o non-stress · 41 acquisitions, 14 frames shown]
[im 2/41]
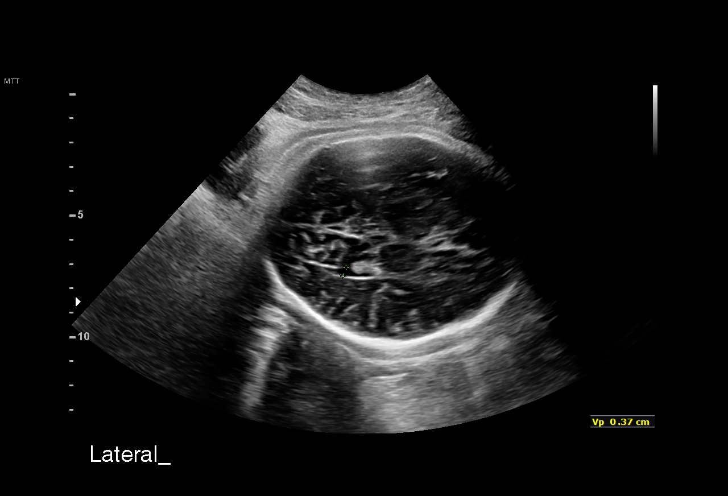
[im 5/41]
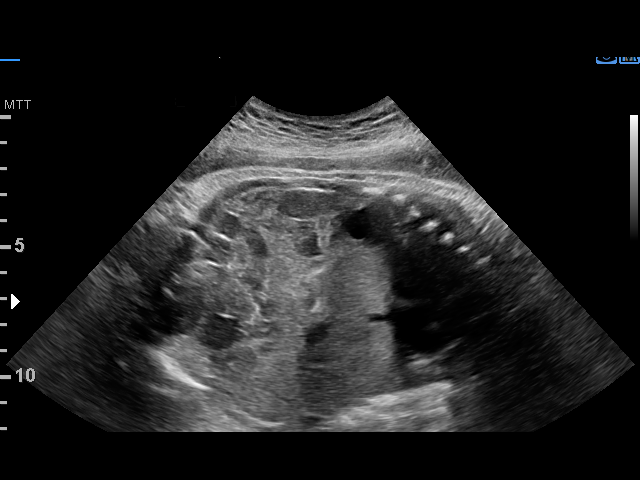
[im 8/41]
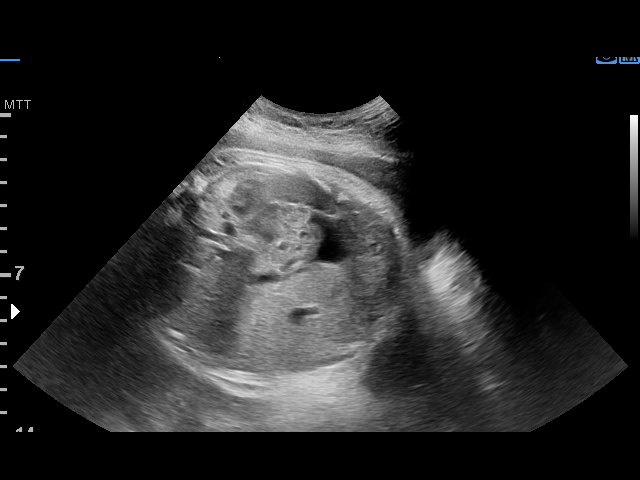
[im 11/41]
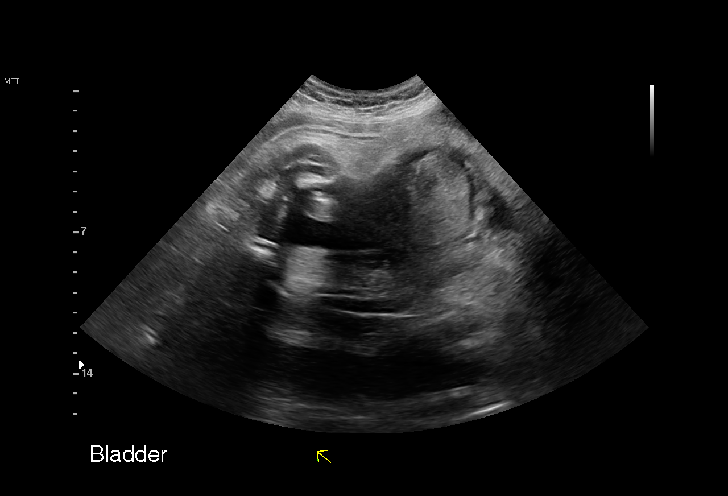
[im 14/41]
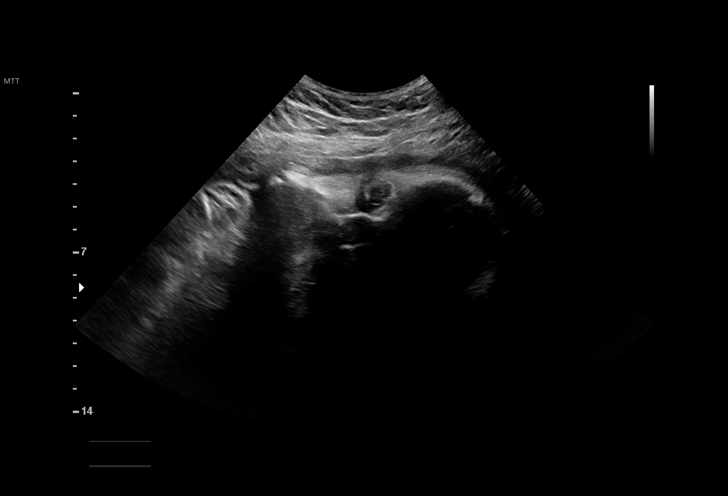
[im 17/41]
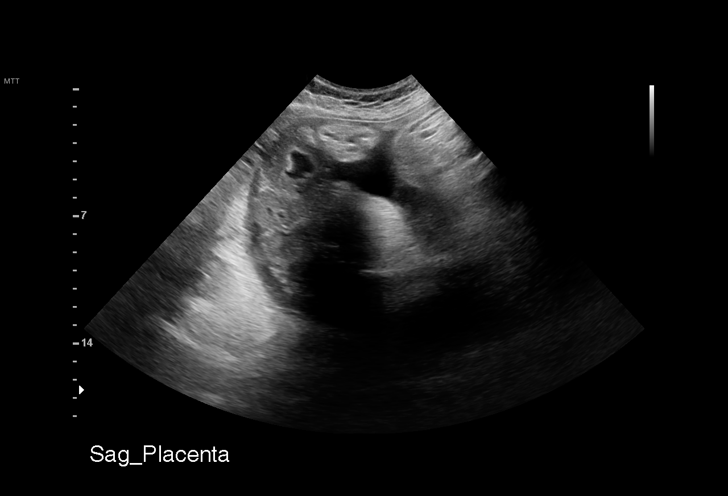
[im 20/41]
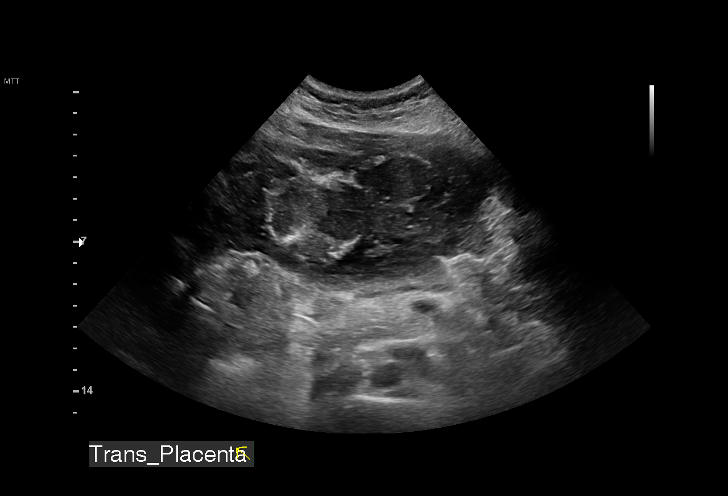
[im 23/41]
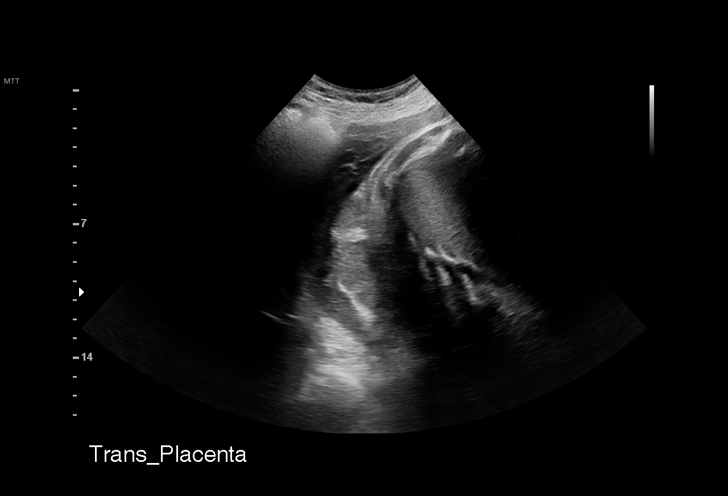
[im 26/41]
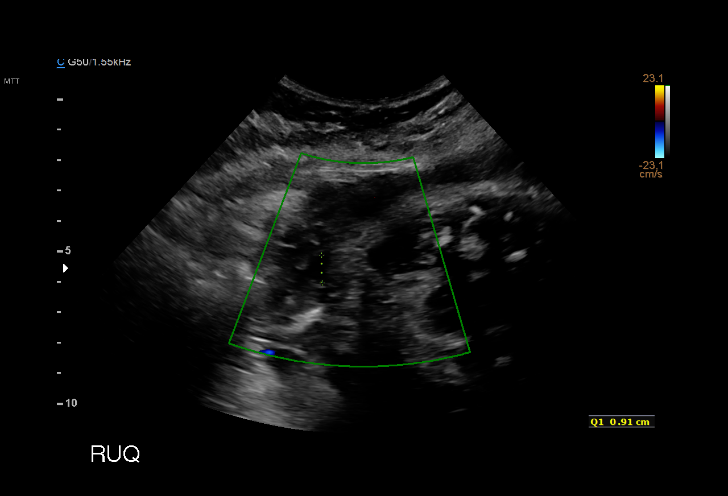
[im 29/41]
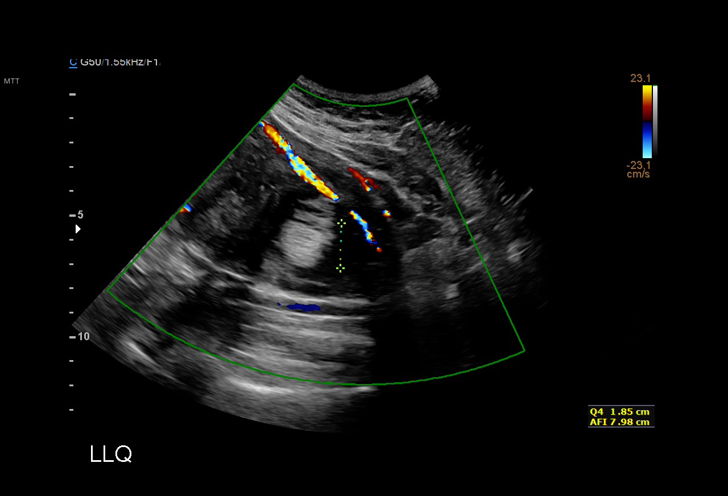
[im 32/41]
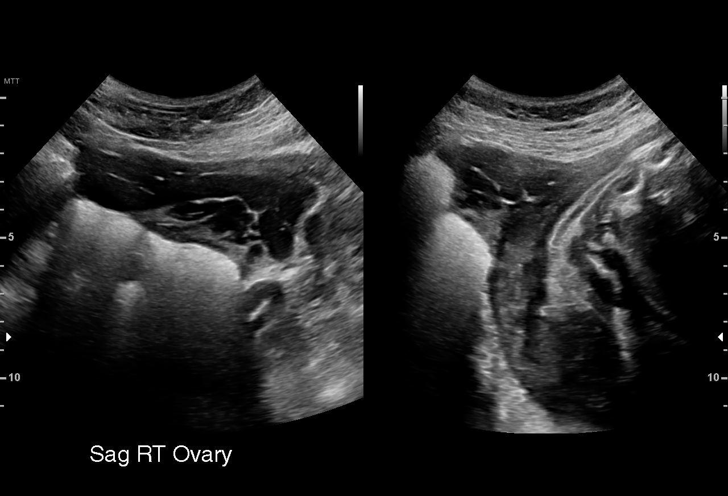
[im 35/41]
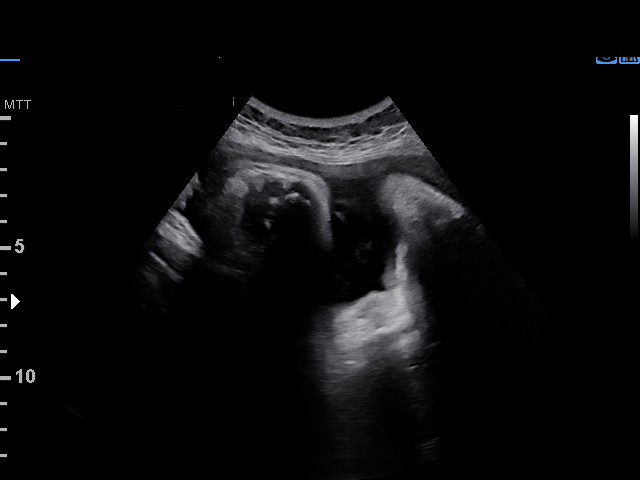
[im 38/41]
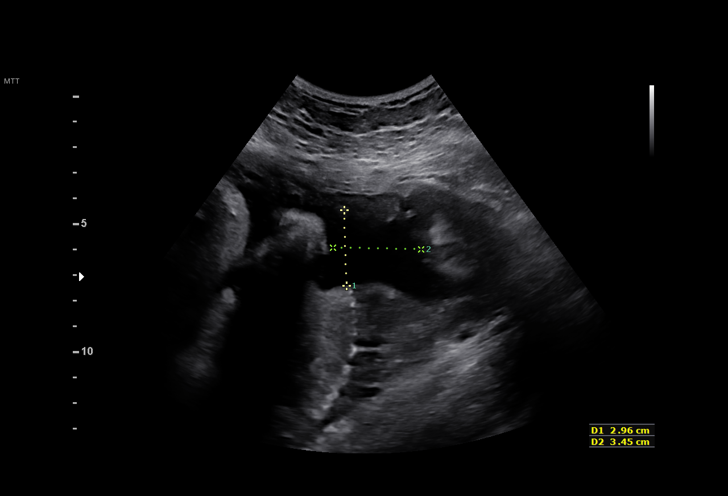
[im 41/41]
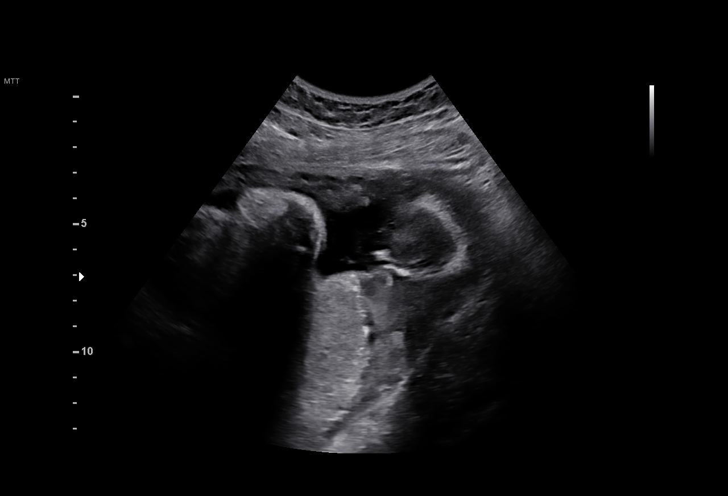

[14 of 28 positions shown; findings below may reference images not displayed]

CEEJAY

                                                            [address]

 ----------------------------------------------------------------------

 ----------------------------------------------------------------------
Indications

  Gestational diabetes in pregnancy, insulin
  controlled (metformin and insulin)
  38 weeks gestation of pregnancy
  Advanced maternal age primigravida 35+,
  third trimester (neg QUAD)
  Encounter for other antenatal screening
  follow-up
 ----------------------------------------------------------------------
Fetal Evaluation

 Num Of Fetuses:         1
 Cardiac Activity:       Observed
 Presentation:           Cephalic
 Placenta:               Left lateral
 P. Cord Insertion:      Previously Visualized

 Amniotic Fluid
 AFI FV:      Within normal limits
Biophysical Evaluation

 Amniotic F.V:   Pocket => 2 cm             F. Tone:        Observed
 F. Movement:    Observed                   Score:          [DATE]
 F. Breathing:   Observed
OB History

 Gravidity:    1         Term:   0        Prem:   0        SAB:   0
 TOP:          0       Ectopic:  0        Living: 0
Gestational Age
 LMP:           38w 0d        Date:  10/28/18                 EDD:   08/04/19
 Best:          38w 0d     Det. By:  LMP  (10/28/18)          EDD:   08/04/19
Anatomy

 Ventricles:            Appears normal         Stomach:                Appears normal, left
                                                                       sided
 Heart:                 Appears normal         Kidneys:                Appear normal
                        (4CH, axis, and
                        situs)
 Diaphragm:             Appears normal         Bladder:                Appears normal
Cervix Uterus Adnexa

 Cervix
 Not visualized (advanced GA >96wks)

 Uterus
 No abnormality visualized.

 Left Ovary
 Within normal limits. No adnexal mass visualized.

 Right Ovary
 Within normal limits. No adnexal mass visualized.

 Cul De Sac
 No free fluid seen.

 Adnexa
 No abnormality visualized.
Comments

 This patient was seen for a biophysical profile due to
 gestational diabetes that is currently controlled with insulin
 and Metformin.  She denies any problems since her last
 exam.
 A biophysical profile performed today was [DATE].
 Low normal amniotic fluid was noted on today's exam with a
 total AFI of over 7 cm.  A 3 x 3 cm pocket of amniotic fluid
 was noted.
 Due to gestational diabetes, delivery should be considered at
 around 39 weeks.
 Another biophysical profile was scheduled in 1 week.
